# Patient Record
Sex: Female | Born: 1955 | Race: White | Hispanic: No | Marital: Single | Smoking: Never smoker
Health system: Southern US, Community
[De-identification: ages and names within clinical notes are randomized; demographics above are authoritative.]

## PROBLEM LIST (undated history)

## (undated) DIAGNOSIS — K219 Gastro-esophageal reflux disease without esophagitis: Secondary | ICD-10-CM

## (undated) DIAGNOSIS — E039 Hypothyroidism, unspecified: Secondary | ICD-10-CM

## (undated) DIAGNOSIS — E079 Disorder of thyroid, unspecified: Secondary | ICD-10-CM

## (undated) DIAGNOSIS — I10 Essential (primary) hypertension: Secondary | ICD-10-CM

## (undated) HISTORY — PX: MENISCUS REPAIR: SHX5179

## (undated) HISTORY — PX: SHOULDER SURGERY: SHX246

## (undated) HISTORY — PX: BREAST BIOPSY: SHX20

---

## 2004-03-10 ENCOUNTER — Ambulatory Visit: Payer: Self-pay | Admitting: Orthopaedic Surgery

## 2004-04-02 ENCOUNTER — Ambulatory Visit: Payer: Self-pay | Admitting: Orthopaedic Surgery

## 2005-11-08 ENCOUNTER — Ambulatory Visit: Payer: Self-pay | Admitting: Orthopaedic Surgery

## 2005-12-06 ENCOUNTER — Ambulatory Visit: Payer: Self-pay | Admitting: Orthopaedic Surgery

## 2005-12-14 ENCOUNTER — Ambulatory Visit: Payer: Self-pay | Admitting: Internal Medicine

## 2005-12-21 ENCOUNTER — Encounter: Payer: Self-pay | Admitting: Orthopaedic Surgery

## 2006-01-10 ENCOUNTER — Encounter: Payer: Self-pay | Admitting: Orthopaedic Surgery

## 2006-09-15 ENCOUNTER — Ambulatory Visit: Payer: Self-pay | Admitting: Internal Medicine

## 2007-01-06 ENCOUNTER — Ambulatory Visit: Payer: Self-pay | Admitting: Internal Medicine

## 2007-09-29 ENCOUNTER — Ambulatory Visit: Payer: Self-pay | Admitting: Orthopaedic Surgery

## 2008-08-02 ENCOUNTER — Ambulatory Visit: Payer: Self-pay | Admitting: Internal Medicine

## 2008-10-02 ENCOUNTER — Other Ambulatory Visit: Payer: Self-pay | Admitting: Unknown Physician Specialty

## 2009-05-20 ENCOUNTER — Ambulatory Visit: Payer: Self-pay

## 2009-05-30 ENCOUNTER — Ambulatory Visit: Payer: Self-pay | Admitting: Unknown Physician Specialty

## 2009-06-11 ENCOUNTER — Ambulatory Visit: Payer: Self-pay | Admitting: Unknown Physician Specialty

## 2010-10-23 ENCOUNTER — Ambulatory Visit: Payer: Self-pay | Admitting: General Practice

## 2010-11-25 ENCOUNTER — Ambulatory Visit: Payer: Self-pay

## 2010-12-11 ENCOUNTER — Ambulatory Visit: Payer: Self-pay | Admitting: Orthopedic Surgery

## 2010-12-21 ENCOUNTER — Ambulatory Visit: Payer: Self-pay | Admitting: Orthopedic Surgery

## 2011-02-12 ENCOUNTER — Ambulatory Visit: Payer: Self-pay | Admitting: Anesthesiology

## 2011-02-22 ENCOUNTER — Ambulatory Visit: Payer: Self-pay | Admitting: Internal Medicine

## 2011-02-23 ENCOUNTER — Ambulatory Visit: Payer: Self-pay | Admitting: Internal Medicine

## 2011-02-25 ENCOUNTER — Ambulatory Visit: Payer: Self-pay | Admitting: Orthopedic Surgery

## 2011-04-05 ENCOUNTER — Encounter: Payer: Self-pay | Admitting: Orthopedic Surgery

## 2011-04-13 ENCOUNTER — Encounter: Payer: Self-pay | Admitting: Orthopedic Surgery

## 2011-04-14 ENCOUNTER — Ambulatory Visit: Payer: Self-pay | Admitting: Internal Medicine

## 2011-05-14 ENCOUNTER — Encounter: Payer: Self-pay | Admitting: Orthopedic Surgery

## 2011-06-11 ENCOUNTER — Encounter: Payer: Self-pay | Admitting: Orthopedic Surgery

## 2011-07-12 ENCOUNTER — Encounter: Payer: Self-pay | Admitting: Orthopedic Surgery

## 2011-08-11 ENCOUNTER — Encounter: Payer: Self-pay | Admitting: Orthopedic Surgery

## 2011-09-02 ENCOUNTER — Ambulatory Visit: Payer: Self-pay | Admitting: Orthopedic Surgery

## 2011-09-11 ENCOUNTER — Encounter: Payer: Self-pay | Admitting: Orthopedic Surgery

## 2011-09-17 ENCOUNTER — Other Ambulatory Visit: Payer: Self-pay | Admitting: Internal Medicine

## 2011-09-17 LAB — CBC WITH DIFFERENTIAL/PLATELET
Basophil #: 0 10*3/uL (ref 0.0–0.1)
Basophil %: 0.8 %
Eosinophil #: 0.1 10*3/uL (ref 0.0–0.7)
Eosinophil %: 2.2 %
HCT: 42.1 % (ref 35.0–47.0)
HGB: 13.8 g/dL (ref 12.0–16.0)
Lymphocyte #: 1.5 10*3/uL (ref 1.0–3.6)
Lymphocyte %: 31.1 %
MCH: 31.5 pg (ref 26.0–34.0)
MCHC: 32.9 g/dL (ref 32.0–36.0)
MCV: 96 fL (ref 80–100)
Monocyte #: 0.3 x10 3/mm (ref 0.2–0.9)
Monocyte %: 5.8 %
Neutrophil #: 3 10*3/uL (ref 1.4–6.5)
Neutrophil %: 60.1 %
Platelet: 248 10*3/uL (ref 150–440)
RBC: 4.39 10*6/uL (ref 3.80–5.20)
RDW: 12.2 % (ref 11.5–14.5)
WBC: 4.9 10*3/uL (ref 3.6–11.0)

## 2011-09-17 LAB — BASIC METABOLIC PANEL
Anion Gap: 10 (ref 7–16)
BUN: 21 mg/dL — ABNORMAL HIGH (ref 7–18)
Calcium, Total: 9.8 mg/dL (ref 8.5–10.1)
Chloride: 106 mmol/L (ref 98–107)
Co2: 29 mmol/L (ref 21–32)
Creatinine: 0.65 mg/dL (ref 0.60–1.30)
EGFR (African American): >60
EGFR (Non-African Amer.): >60
Glucose: 96 mg/dL (ref 65–99)
Osmolality: 292 (ref 275–301)
Potassium: 4.1 mmol/L (ref 3.5–5.1)
Sodium: 145 mmol/L (ref 136–145)

## 2011-09-17 LAB — TSH: Thyroid Stimulating Horm: 0.079 u[IU]/mL — ABNORMAL LOW

## 2012-03-03 ENCOUNTER — Other Ambulatory Visit: Payer: Self-pay | Admitting: Physician Assistant

## 2012-03-03 LAB — CBC WITH DIFFERENTIAL/PLATELET
Basophil #: 0 10*3/uL (ref 0.0–0.1)
Basophil %: 0.5 %
Eosinophil #: 0.2 10*3/uL (ref 0.0–0.7)
Eosinophil %: 2.5 %
HCT: 40.2 % (ref 35.0–47.0)
HGB: 13.7 g/dL (ref 12.0–16.0)
Lymphocyte #: 1.3 10*3/uL (ref 1.0–3.6)
Lymphocyte %: 19.2 %
MCH: 32.2 pg (ref 26.0–34.0)
MCHC: 33.9 g/dL (ref 32.0–36.0)
MCV: 95 fL (ref 80–100)
Monocyte #: 0.4 x10 3/mm (ref 0.2–0.9)
Monocyte %: 5.9 %
Neutrophil #: 4.9 10*3/uL (ref 1.4–6.5)
Neutrophil %: 71.9 %
Platelet: 238 10*3/uL (ref 150–440)
RBC: 4.23 10*6/uL (ref 3.80–5.20)
RDW: 13.3 % (ref 11.5–14.5)
WBC: 6.8 10*3/uL (ref 3.6–11.0)

## 2012-03-03 LAB — COMPREHENSIVE METABOLIC PANEL
Albumin: 3.9 g/dL (ref 3.4–5.0)
Alkaline Phosphatase: 143 U/L — ABNORMAL HIGH (ref 50–136)
Anion Gap: 5 — ABNORMAL LOW (ref 7–16)
BUN: 18 mg/dL (ref 7–18)
Bilirubin,Total: 0.2 mg/dL (ref 0.2–1.0)
Calcium, Total: 9.3 mg/dL (ref 8.5–10.1)
Chloride: 108 mmol/L — ABNORMAL HIGH (ref 98–107)
Co2: 28 mmol/L (ref 21–32)
Creatinine: 0.58 mg/dL — ABNORMAL LOW (ref 0.60–1.30)
EGFR (African American): >60
EGFR (Non-African Amer.): >60
Glucose: 98 mg/dL (ref 65–99)
Osmolality: 283 (ref 275–301)
Potassium: 3.9 mmol/L (ref 3.5–5.1)
SGOT(AST): 43 U/L — ABNORMAL HIGH (ref 15–37)
SGPT (ALT): 73 U/L (ref 12–78)
Sodium: 141 mmol/L (ref 136–145)
Total Protein: 6.8 g/dL (ref 6.4–8.2)

## 2012-03-03 LAB — FOLATE: Folic Acid: 18.7 ng/mL (ref 3.1–100.0)

## 2012-03-03 LAB — TSH: Thyroid Stimulating Horm: 1.59 u[IU]/mL

## 2012-08-29 ENCOUNTER — Encounter: Payer: Self-pay | Admitting: Anesthesiology

## 2012-09-10 ENCOUNTER — Encounter: Payer: Self-pay | Admitting: Anesthesiology

## 2012-10-10 ENCOUNTER — Encounter: Payer: Self-pay | Admitting: Anesthesiology

## 2013-05-17 ENCOUNTER — Ambulatory Visit: Payer: Self-pay | Admitting: Internal Medicine

## 2014-02-07 ENCOUNTER — Ambulatory Visit: Payer: Self-pay | Admitting: Unknown Physician Specialty

## 2014-05-06 DIAGNOSIS — G629 Polyneuropathy, unspecified: Secondary | ICD-10-CM | POA: Insufficient documentation

## 2014-05-17 ENCOUNTER — Ambulatory Visit: Payer: Self-pay | Admitting: Internal Medicine

## 2014-05-17 DIAGNOSIS — M7989 Other specified soft tissue disorders: Secondary | ICD-10-CM | POA: Insufficient documentation

## 2014-07-16 ENCOUNTER — Encounter: Payer: Self-pay | Admitting: *Deleted

## 2015-05-12 DIAGNOSIS — I10 Essential (primary) hypertension: Secondary | ICD-10-CM | POA: Diagnosis not present

## 2015-05-12 DIAGNOSIS — M810 Age-related osteoporosis without current pathological fracture: Secondary | ICD-10-CM | POA: Diagnosis not present

## 2015-05-12 DIAGNOSIS — G629 Polyneuropathy, unspecified: Secondary | ICD-10-CM | POA: Diagnosis not present

## 2015-05-12 DIAGNOSIS — E079 Disorder of thyroid, unspecified: Secondary | ICD-10-CM | POA: Diagnosis not present

## 2015-05-22 DIAGNOSIS — M810 Age-related osteoporosis without current pathological fracture: Secondary | ICD-10-CM | POA: Diagnosis not present

## 2015-05-27 DIAGNOSIS — Z1231 Encounter for screening mammogram for malignant neoplasm of breast: Secondary | ICD-10-CM | POA: Diagnosis not present

## 2015-05-27 DIAGNOSIS — M810 Age-related osteoporosis without current pathological fracture: Secondary | ICD-10-CM | POA: Diagnosis not present

## 2015-05-27 DIAGNOSIS — Z01419 Encounter for gynecological examination (general) (routine) without abnormal findings: Secondary | ICD-10-CM | POA: Diagnosis not present

## 2015-05-27 DIAGNOSIS — Z1211 Encounter for screening for malignant neoplasm of colon: Secondary | ICD-10-CM | POA: Diagnosis not present

## 2015-05-27 DIAGNOSIS — N951 Menopausal and female climacteric states: Secondary | ICD-10-CM | POA: Diagnosis not present

## 2015-06-25 DIAGNOSIS — E039 Hypothyroidism, unspecified: Secondary | ICD-10-CM | POA: Diagnosis not present

## 2015-06-27 ENCOUNTER — Encounter: Payer: Self-pay | Admitting: Physician Assistant

## 2015-06-27 ENCOUNTER — Ambulatory Visit: Payer: Self-pay | Admitting: Physician Assistant

## 2015-06-27 VITALS — BP 122/90 | HR 76 | Temp 99.1°F

## 2015-06-27 DIAGNOSIS — J101 Influenza due to other identified influenza virus with other respiratory manifestations: Secondary | ICD-10-CM

## 2015-06-27 LAB — POCT INFLUENZA A/B
Influenza A, POC: POSITIVE — AB
Influenza B, POC: NEGATIVE

## 2015-06-27 MED ORDER — OSELTAMIVIR PHOSPHATE 75 MG PO CAPS: 75.0000 mg | ORAL_CAPSULE | Freq: Two times a day (BID) | ORAL | Status: AC

## 2015-06-27 NOTE — Progress Notes (Signed)
S: C/o runny nose, congestion, sore throat with dry cough for 3 days, + fever, chills, denies cp/sob, v/d; mucus was green this am but clear throughout the day, cough is sporadic, throat is really sore  Using otc meds: robitussin  O: PE: vitals w low grade temp, nad,  perrl eomi, normocephalic, tms dull, nasal mucosa red and swollen, throat injected, neck supple no lymph, lungs c t a, cv rrr, neuro intact, flu swab + A  A:  Acute flu like illness, influenza A   P: tamiflu 75mg  bid x 5d, drink fluids, continue regular meds , use otc meds of choice, return if not improving in 5 days, return earlier if worsening

## 2015-06-30 DIAGNOSIS — J101 Influenza due to other identified influenza virus with other respiratory manifestations: Secondary | ICD-10-CM | POA: Diagnosis not present

## 2015-06-30 DIAGNOSIS — J019 Acute sinusitis, unspecified: Secondary | ICD-10-CM | POA: Diagnosis not present

## 2015-07-22 DIAGNOSIS — M810 Age-related osteoporosis without current pathological fracture: Secondary | ICD-10-CM | POA: Diagnosis not present

## 2015-08-20 DIAGNOSIS — R102 Pelvic and perineal pain: Secondary | ICD-10-CM | POA: Diagnosis not present

## 2015-08-21 DIAGNOSIS — M79601 Pain in right arm: Secondary | ICD-10-CM | POA: Diagnosis not present

## 2015-08-21 DIAGNOSIS — M79671 Pain in right foot: Secondary | ICD-10-CM | POA: Diagnosis not present

## 2015-08-21 DIAGNOSIS — M79672 Pain in left foot: Secondary | ICD-10-CM | POA: Diagnosis not present

## 2015-08-21 DIAGNOSIS — E039 Hypothyroidism, unspecified: Secondary | ICD-10-CM | POA: Diagnosis not present

## 2015-08-21 DIAGNOSIS — M722 Plantar fascial fibromatosis: Secondary | ICD-10-CM | POA: Diagnosis not present

## 2015-08-21 DIAGNOSIS — Z4802 Encounter for removal of sutures: Secondary | ICD-10-CM | POA: Diagnosis not present

## 2015-09-03 ENCOUNTER — Ambulatory Visit: Payer: Self-pay | Admitting: Podiatry

## 2015-09-04 DIAGNOSIS — E039 Hypothyroidism, unspecified: Secondary | ICD-10-CM | POA: Diagnosis not present

## 2015-09-15 DIAGNOSIS — H43813 Vitreous degeneration, bilateral: Secondary | ICD-10-CM | POA: Diagnosis not present

## 2016-02-10 DIAGNOSIS — E034 Atrophy of thyroid (acquired): Secondary | ICD-10-CM | POA: Diagnosis not present

## 2016-02-19 DIAGNOSIS — E079 Disorder of thyroid, unspecified: Secondary | ICD-10-CM | POA: Diagnosis not present

## 2016-02-19 DIAGNOSIS — G629 Polyneuropathy, unspecified: Secondary | ICD-10-CM | POA: Diagnosis not present

## 2016-02-19 DIAGNOSIS — M810 Age-related osteoporosis without current pathological fracture: Secondary | ICD-10-CM | POA: Diagnosis not present

## 2016-02-19 DIAGNOSIS — I10 Essential (primary) hypertension: Secondary | ICD-10-CM | POA: Diagnosis not present

## 2016-05-18 DIAGNOSIS — M79671 Pain in right foot: Secondary | ICD-10-CM | POA: Diagnosis not present

## 2016-05-18 DIAGNOSIS — M722 Plantar fascial fibromatosis: Secondary | ICD-10-CM | POA: Diagnosis not present

## 2016-05-25 DIAGNOSIS — M722 Plantar fascial fibromatosis: Secondary | ICD-10-CM | POA: Diagnosis not present

## 2016-05-25 DIAGNOSIS — M79671 Pain in right foot: Secondary | ICD-10-CM | POA: Diagnosis not present

## 2016-06-21 ENCOUNTER — Telehealth: Payer: Self-pay | Admitting: Family

## 2016-06-21 DIAGNOSIS — J019 Acute sinusitis, unspecified: Secondary | ICD-10-CM

## 2016-06-21 MED ORDER — AMOXICILLIN-POT CLAVULANATE 875-125 MG PO TABS: 1.0000 | ORAL_TABLET | Freq: Two times a day (BID) | ORAL | 0 refills | Status: DC

## 2016-06-21 NOTE — Progress Notes (Signed)

## 2016-08-10 DIAGNOSIS — I10 Essential (primary) hypertension: Secondary | ICD-10-CM | POA: Diagnosis not present

## 2016-08-10 DIAGNOSIS — Z79899 Other long term (current) drug therapy: Secondary | ICD-10-CM | POA: Diagnosis not present

## 2016-08-10 DIAGNOSIS — E079 Disorder of thyroid, unspecified: Secondary | ICD-10-CM | POA: Diagnosis not present

## 2016-08-11 ENCOUNTER — Emergency Department
Admission: EM | Admit: 2016-08-11 | Discharge: 2016-08-11 | Disposition: A | Discharge status: Home / Self Care | Payer: 59 | Attending: Student in an Organized Health Care Education/Training Program | Admitting: Student in an Organized Health Care Education/Training Program

## 2016-08-11 ENCOUNTER — Encounter: Payer: Self-pay | Admitting: Emergency Medicine

## 2016-08-11 ENCOUNTER — Emergency Department: Payer: 59

## 2016-08-11 DIAGNOSIS — Y929 Unspecified place or not applicable: Secondary | ICD-10-CM | POA: Insufficient documentation

## 2016-08-11 DIAGNOSIS — Y9301 Activity, walking, marching and hiking: Secondary | ICD-10-CM | POA: Diagnosis not present

## 2016-08-11 DIAGNOSIS — Z79899 Other long term (current) drug therapy: Secondary | ICD-10-CM | POA: Diagnosis not present

## 2016-08-11 DIAGNOSIS — E039 Hypothyroidism, unspecified: Secondary | ICD-10-CM | POA: Insufficient documentation

## 2016-08-11 DIAGNOSIS — S52614A Nondisplaced fracture of right ulna styloid process, initial encounter for closed fracture: Secondary | ICD-10-CM | POA: Diagnosis not present

## 2016-08-11 DIAGNOSIS — I10 Essential (primary) hypertension: Secondary | ICD-10-CM | POA: Insufficient documentation

## 2016-08-11 DIAGNOSIS — S52611A Displaced fracture of right ulna styloid process, initial encounter for closed fracture: Secondary | ICD-10-CM | POA: Diagnosis not present

## 2016-08-11 DIAGNOSIS — M7989 Other specified soft tissue disorders: Secondary | ICD-10-CM | POA: Diagnosis not present

## 2016-08-11 DIAGNOSIS — W010XXA Fall on same level from slipping, tripping and stumbling without subsequent striking against object, initial encounter: Secondary | ICD-10-CM | POA: Diagnosis not present

## 2016-08-11 DIAGNOSIS — G629 Polyneuropathy, unspecified: Secondary | ICD-10-CM | POA: Diagnosis not present

## 2016-08-11 DIAGNOSIS — Z0000 Encounter for general adult medical examination without abnormal findings: Secondary | ICD-10-CM | POA: Diagnosis not present

## 2016-08-11 DIAGNOSIS — E079 Disorder of thyroid, unspecified: Secondary | ICD-10-CM | POA: Diagnosis not present

## 2016-08-11 DIAGNOSIS — Y999 Unspecified external cause status: Secondary | ICD-10-CM | POA: Diagnosis not present

## 2016-08-11 DIAGNOSIS — S6991XA Unspecified injury of right wrist, hand and finger(s), initial encounter: Secondary | ICD-10-CM | POA: Diagnosis present

## 2016-08-11 DIAGNOSIS — S52501A Unspecified fracture of the lower end of right radius, initial encounter for closed fracture: Secondary | ICD-10-CM

## 2016-08-11 HISTORY — DX: Disorder of thyroid, unspecified: E07.9

## 2016-08-11 HISTORY — DX: Essential (primary) hypertension: I10

## 2016-08-11 MED ORDER — ONDANSETRON HCL 4 MG PO TABS: 4.0000 mg | ORAL_TABLET | Freq: Three times a day (TID) | ORAL | 1 refills | Status: AC | PRN

## 2016-08-11 MED ORDER — ONDANSETRON HCL 4 MG PO TABS
4.0000 mg | ORAL_TABLET | Freq: Once | ORAL | Status: AC
  Administered 2016-08-11: 4 mg via ORAL
  Filled 2016-08-11: qty 1

## 2016-08-11 MED ORDER — OXYCODONE-ACETAMINOPHEN 5-325 MG PO TABS: 1.0000 | ORAL_TABLET | Freq: Four times a day (QID) | ORAL | 0 refills | Status: AC | PRN

## 2016-08-11 MED ORDER — OXYCODONE-ACETAMINOPHEN 5-325 MG PO TABS
1.0000 | ORAL_TABLET | Freq: Once | ORAL | Status: AC
Start: 2016-08-11 — End: 2016-08-11
  Administered 2016-08-11: 1 via ORAL
  Filled 2016-08-11: qty 1

## 2016-08-11 NOTE — ED Notes (Signed)
See triage note  States she tripped and landed on right wrist   Positive deformity   Positive pulses

## 2016-08-11 NOTE — ED Provider Notes (Signed)
[facility] Emergency Department Provider Note  ____________________________________________  Time seen: Approximately 7:30 PM  I have reviewed the triage vital signs and the nursing notes.   HISTORY  Chief Complaint Wrist Pain    HPI Erin Bruce is a 61 y.o. female presenting to the emergency department with acute 10 out of 10 right wrist pain. Patient states "pain is significant". She states that she was ambulating down the street when she tripped. Patient did not hit her head or lose consciousness. Patient denies prior traumas or surgeries affecting the right upper extremity. Patient denies radiculopathy, weakness or loss of sensation. Patient denies associated chest pain, chest tightness, shortness of breath, nausea, vomiting or abdominal pain. Patient's pain is worsened with movement. Patient currently works as a Copy. No alleviating measures have been undertaken.   Past Medical History:  Diagnosis Date  . Hypertension   . Thyroid disease     There are no active problems to display for this patient.   Past Surgical History:  Procedure Laterality Date  . SHOULDER SURGERY      Prior to Admission medications   Medication Sig Start Date End Date Taking? Authorizing Provider  amoxicillin-clavulanate (AUGMENTIN) 875-125 MG tablet Take 1 tablet by mouth 2 (two) times daily. 06/21/16   Junie Spencer, FNP  ARMOUR THYROID 15 MG tablet  05/20/15   Historical Provider, MD  enalapril (VASOTEC) 10 MG tablet Take by mouth. 05/12/15 05/11/16  Historical Provider, MD  HYDROcodone-acetaminophen (NORCO/VICODIN) 5-325 MG tablet Take by mouth. 05/27/15   Historical Provider, MD  losartan (COZAAR) 50 MG tablet  06/23/15   Historical Provider, MD  omeprazole (PRILOSEC) 40 MG capsule  06/23/15   Historical Provider, MD  tiZANidine (ZANAFLEX) 4 MG tablet  06/23/15   Historical Provider, MD    Allergies Alendronate; Buspirone; Diltiazem; and Tramadol  No  family history on file.  Social History Social History  Substance Use Topics  . Smoking status: Never Smoker  . Smokeless tobacco: Not on file  . Alcohol use Not on file     Review of Systems  Constitutional: No fever/chills Eyes: No visual changes. No discharge ENT: No upper respiratory complaints. Cardiovascular: no chest pain. Respiratory: no cough. No SOB. Gastrointestinal: No abdominal pain.  No nausea, no vomiting.  No diarrhea.  No constipation. Musculoskeletal: Patient has right wrist pain. Skin: Negative for rash, abrasions, lacerations, ecchymosis. Neurological: Negative for headaches, focal weakness or numbness.   ____________________________________________   PHYSICAL EXAM:  VITAL SIGNS: ED Triage Vitals  Enc Vitals Group     BP 08/11/16 1836 (!) 143/89     Pulse Rate 08/11/16 1836 (!) 104     Resp 08/11/16 1836 20     Temp 08/11/16 1836 98.4 F (36.9 C)     Temp Source 08/11/16 1836 Oral     SpO2 08/11/16 1836 96 %     Weight 08/11/16 1837 146 lb (66.2 kg)     Height 08/11/16 1837  (1.702 m)     Head Circumference --      Peak Flow --      Pain Score 08/11/16 1836 10     Pain Loc --      Pain Edu? --      Excl. in GC? --      Constitutional: Alert and oriented. Well appearing and in no acute distress. Eyes: Conjunctivae are normal. PERRL. EOMI. Head: Atraumatic. Hematological/Lymphatic/Immunilogical: No cervical lymphadenopathy. Cardiovascular: Normal rate, regular rhythm. Normal S1  and S2.  Good peripheral circulation. Respiratory: Normal respiratory effort without tachypnea or retractions. Lungs CTAB. Good air entry to the bases with no decreased or absent breath sounds. Musculoskeletal:Patient has 5 out of 5 strength in the upper extremities bilaterally. Right upper extremity: Patient is able to perform full range of motion at the right shoulder and right elbow. Patient is able to perform limited flexion and extension at the right wrist,  likely secondary to pain. Patient is able to move all 5 right fingers. To inspection, wrist deformity visualized. Palpable radial and ulnar pulses bilaterally and symmetrically. Neurologic:  Normal speech and language. No gross focal neurologic deficits are appreciated. Reflexes are 2+ and symmetric in the upper extremities bilaterally. Skin:  No skin compromise visualized. Psychiatric: Mood and affect are normal. Speech and behavior are normal. Patient exhibits appropriate insight and judgement.   ____________________________________________   LABS (all labs ordered are listed, but only abnormal results are displayed)  Labs Reviewed - No data to display ____________________________________________  EKG   ____________________________________________  RADIOLOGY Geraldo Pitter, personally viewed and evaluated these images (plain radiographs) as part of my medical decision making, as well as reviewing the written report by the radiologist.  Dg Wrist Complete Right  Result Date: 08/11/2016 CLINICAL DATA:  Tripped and fell onto the outstretched right hand today. Pain and deformity. EXAM: RIGHT WRIST - COMPLETE 3+ VIEW COMPARISON:  None. FINDINGS: There is a comminuted fracture of the distal radial metaphysis extending to the epiphysis. The primary fracture is transverse with secondary fractures extending to the dorsal ulnar articular surface. The fracture is impacted dorsally leading to significant dorsal angulation of the distal radial articular surface, of 45 degrees. There is associated ulnar styloid fracture without significant displacement. The joints are normally spaced and aligned. There is significant surrounding soft tissue swelling. IMPRESSION: 1. Comminuted, dorsally impacted and angulated, fracture of the distal radial metaphysis with an intra-articular component. 2. Associated ulnar styloid fracture. 3. No dislocation. Electronically Signed   By: Amie Portland M.D.   On: 08/11/2016  19:00    ____________________________________________    PROCEDURES  Procedure(s) performed:    Procedures    Medications  oxyCODONE-acetaminophen (PERCOCET/ROXICET) 5-325 MG per tablet 1 tablet (not administered)  ondansetron (ZOFRAN) tablet 4 mg (not administered)     ____________________________________________   INITIAL IMPRESSION / ASSESSMENT AND PLAN / ED COURSE  Pertinent labs & imaging results that were available during my care of the patient were reviewed by me and considered in my medical decision making (see chart for details).  Review of the [place] CSRS was performed in accordance of the [facility] prior to dispensing any controlled drugs.     Assessment and plan: Right Wrist Pain: Patient presents to the emergency department with right wrist pain after tripping and falling on an outstretched right hand. DG right wrist reveals a comminuted distal radius fracture with involvement of the ulna. Ulnar styloid fracture visualized. Patient was placed in a volar splint in the emergency department. Patient was neurovascularly intact after splint application. A Roxicet was provided in the emergency department. Patient was discharged with Roxicet. A referral was given orthopedics, Dr. Ernest Pine.  Vital signs were reassuring prior to discharge. All patient questions were answered. ____________________________________________  FINAL CLINICAL IMPRESSION(S) / ED DIAGNOSES  Final diagnoses:  None      NEW MEDICATIONS STARTED DURING THIS VISIT:  New Prescriptions   No medications on file        This chart was dictated using  voice recognition software/Dragon. Despite best efforts to proofread, errors can occur which can change the meaning. Any change was purely unintentional.    Orvil Feil, PA-C 08/11/16 1944    Willy Eddy, MD 08/12/16 [phone]

## 2016-08-11 NOTE — ED Triage Notes (Signed)
Tripped and fell while walking, deformity R wrist. Ring removed during triage.

## 2016-08-12 DIAGNOSIS — S52531A Colles' fracture of right radius, initial encounter for closed fracture: Secondary | ICD-10-CM | POA: Diagnosis not present

## 2016-08-13 ENCOUNTER — Other Ambulatory Visit: Payer: Self-pay | Admitting: Orthopedic Surgery

## 2016-08-13 ENCOUNTER — Encounter
Admission: RE | Admit: 2016-08-13 | Discharge: 2016-08-13 | Disposition: A | Discharge status: Home / Self Care | Payer: 59 | Source: Ambulatory Visit | Attending: Orthopedic Surgery | Admitting: Orthopedic Surgery

## 2016-08-13 HISTORY — DX: Gastro-esophageal reflux disease without esophagitis: K21.9

## 2016-08-13 HISTORY — DX: Hypothyroidism, unspecified: E03.9

## 2016-08-13 NOTE — Patient Instructions (Signed)
  Your procedure is scheduled on: 08-17-16 (Tuesday) Report to Same Day Surgery 2nd floor medical mall [facility](Medical Mall Entrance-take elevator on left to 2nd floor.  Check in with surgery information desk.) To find out your arrival time please call [phone] between 1PM - 3PM on 08-16-16 (Monday)  Remember: Instructions that are not followed completely may result in serious medical risk, up to and including death, or upon the discretion of your surgeon and anesthesiologist your surgery may need to be rescheduled.    _x___ 1. Do not eat food or drink liquids after midnight. No gum chewing or hard candies.     __x__ 2. No Alcohol for 24 hours before or after surgery.   __x__3. No Smoking for 24 prior to surgery.   ____  4. Bring all medications with you on the day of surgery if instructed.    __x__ 5. Notify your doctor if there is any change in your medical condition     (cold, fever, infections).     Do not wear jewelry, make-up, hairpins, clips or nail polish.  Do not wear lotions, powders, or perfumes. You may wear deodorant.  Do not shave 48 hours prior to surgery. Men may shave face and neck.  Do not bring valuables to the hospital.    [facility] any belongings or valuables.               Contacts, dentures or bridgework may not be worn into surgery.  Leave your suitcase in the car. After surgery it may be brought to your room.  For patients admitted to the hospital, discharge time is determined by your  treatment team.   Patients discharged the day of surgery will not be allowed to drive home.  You will need someone to drive you home and stay with you the night of your procedure.    Please read over the following fact sheets that you were given:     _x___ TAKE THE FOLLOWING MEDICATIONS THE MORNING OF SURGERY WITH A SMALL SIP OF WATER. These include:  1. VASOTEC  2. LEVOTHYROXINE  3. PRILOSEC  4. TAKE AN EXTRA PRILOSEC ON Monday NIGHT BEFORE BED  5. YOU  MAY TAKE PERCOCET AM OF SURGERY IF NEEDED  6.  ____Fleets enema or Magnesium Citrate as directed.   _x___ Use CHG Soap or sage wipes as directed on instruction sheet   ____ Use inhalers on the day of surgery and bring to hospital day of surgery  ____ Stop Metformin and Janumet 2 days prior to surgery.    ____ Take 1/2 of usual insulin dose the night before surgery and none on the morning surgery.   ____ Follow recommendations from Cardiologist, Pulmonologist or PCP regarding stopping Aspirin, Coumadin, Pllavix ,Eliquis, Effient, or Pradaxa, and Pletal.  X____Stop Anti-inflammatories such as Advil, Aleve, Ibuprofen, Motrin, Naproxen, Naprosyn, Goodies powders or aspirin products NOW-OK to take Tylenol OR PERCOCET    ____ Stop supplements until after surgery   ____ Bring C-Pap to the hospital.

## 2016-08-16 NOTE — Pre-Procedure Instructions (Signed)
MEDICAL CLEARANCE ON CHART FROM DR Graciela HusbandsKLEIN

## 2016-08-17 ENCOUNTER — Ambulatory Visit
Admission: RE | Admit: 2016-08-17 | Discharge: 2016-08-17 | Disposition: A | Discharge status: Home / Self Care | Payer: 59 | Source: Ambulatory Visit | Attending: Orthopedic Surgery | Admitting: Orthopedic Surgery

## 2016-08-17 ENCOUNTER — Ambulatory Visit: Payer: 59 | Admitting: Certified Registered"

## 2016-08-17 ENCOUNTER — Encounter
Admission: RE | Disposition: A | Discharge status: Home / Self Care | Payer: Self-pay | Source: Ambulatory Visit | Attending: Orthopedic Surgery

## 2016-08-17 DIAGNOSIS — S52531A Colles' fracture of right radius, initial encounter for closed fracture: Secondary | ICD-10-CM | POA: Insufficient documentation

## 2016-08-17 DIAGNOSIS — K219 Gastro-esophageal reflux disease without esophagitis: Secondary | ICD-10-CM | POA: Insufficient documentation

## 2016-08-17 DIAGNOSIS — Z888 Allergy status to other drugs, medicaments and biological substances status: Secondary | ICD-10-CM | POA: Insufficient documentation

## 2016-08-17 DIAGNOSIS — Z79899 Other long term (current) drug therapy: Secondary | ICD-10-CM | POA: Insufficient documentation

## 2016-08-17 DIAGNOSIS — G8918 Other acute postprocedural pain: Secondary | ICD-10-CM | POA: Diagnosis not present

## 2016-08-17 DIAGNOSIS — I10 Essential (primary) hypertension: Secondary | ICD-10-CM | POA: Insufficient documentation

## 2016-08-17 DIAGNOSIS — S52501A Unspecified fracture of the lower end of right radius, initial encounter for closed fracture: Secondary | ICD-10-CM | POA: Diagnosis present

## 2016-08-17 DIAGNOSIS — Y939 Activity, unspecified: Secondary | ICD-10-CM | POA: Diagnosis not present

## 2016-08-17 DIAGNOSIS — E039 Hypothyroidism, unspecified: Secondary | ICD-10-CM | POA: Insufficient documentation

## 2016-08-17 DIAGNOSIS — M25531 Pain in right wrist: Secondary | ICD-10-CM | POA: Diagnosis not present

## 2016-08-17 DIAGNOSIS — W010XXA Fall on same level from slipping, tripping and stumbling without subsequent striking against object, initial encounter: Secondary | ICD-10-CM | POA: Diagnosis not present

## 2016-08-17 HISTORY — PX: OPEN REDUCTION INTERNAL FIXATION (ORIF) DISTAL RADIAL FRACTURE: SHX5989

## 2016-08-17 LAB — PROTIME-INR
INR: 0.95
Prothrombin Time: 12.7 seconds (ref 11.4–15.2)

## 2016-08-17 LAB — APTT: aPTT: 26 seconds (ref 24–36)

## 2016-08-17 SURGERY — OPEN REDUCTION INTERNAL FIXATION (ORIF) DISTAL RADIUS FRACTURE
Anesthesia: Regional | Site: Wrist | Laterality: Right | Wound class: Clean

## 2016-08-17 MED ORDER — OXYCODONE HCL 5 MG PO TABS: 5.0000 mg | ORAL_TABLET | ORAL | 0 refills | Status: DC | PRN

## 2016-08-17 MED ORDER — FENTANYL CITRATE (PF) 100 MCG/2ML IJ SOLN
INTRAMUSCULAR | Status: AC
  Administered 2016-08-17: 100 ug
  Filled 2016-08-17: qty 2

## 2016-08-17 MED ORDER — ONDANSETRON HCL 4 MG PO TABS: 4.0000 mg | ORAL_TABLET | Freq: Three times a day (TID) | ORAL | 0 refills | Status: DC | PRN

## 2016-08-17 MED ORDER — KETOROLAC TROMETHAMINE 30 MG/ML IJ SOLN
INTRAMUSCULAR | Status: AC
  Filled 2016-08-17: qty 1

## 2016-08-17 MED ORDER — KETOROLAC TROMETHAMINE 30 MG/ML IJ SOLN
INTRAMUSCULAR | Status: DC | PRN
  Administered 2016-08-17: 30 mg via INTRAVENOUS

## 2016-08-17 MED ORDER — LIDOCAINE HCL (PF) 2 % IJ SOLN
INTRAMUSCULAR | Status: AC
  Filled 2016-08-17: qty 2

## 2016-08-17 MED ORDER — PROPOFOL 10 MG/ML IV BOLUS
INTRAVENOUS | Status: AC
  Filled 2016-08-17: qty 20

## 2016-08-17 MED ORDER — ONDANSETRON HCL 4 MG/2ML IJ SOLN
INTRAMUSCULAR | Status: DC | PRN
  Administered 2016-08-17: 4 mg via INTRAVENOUS

## 2016-08-17 MED ORDER — FENTANYL CITRATE (PF) 100 MCG/2ML IJ SOLN
INTRAMUSCULAR | Status: AC
  Filled 2016-08-17: qty 2

## 2016-08-17 MED ORDER — LIDOCAINE HCL (PF) 1 % IJ SOLN
INTRAMUSCULAR | Status: DC | PRN
  Administered 2016-08-17: 1 mL via INTRADERMAL

## 2016-08-17 MED ORDER — NEOMYCIN-POLYMYXIN B GU 40-200000 IR SOLN
Status: DC | PRN
  Administered 2016-08-17: 2 mL

## 2016-08-17 MED ORDER — ROPIVACAINE HCL 2 MG/ML IJ SOLN
INTRAMUSCULAR | Status: AC
  Filled 2016-08-17: qty 20

## 2016-08-17 MED ORDER — BUPIVACAINE HCL (PF) 0.5 % IJ SOLN
INTRAMUSCULAR | Status: AC
  Filled 2016-08-17: qty 30

## 2016-08-17 MED ORDER — GLYCOPYRROLATE 0.2 MG/ML IJ SOLN
INTRAMUSCULAR | Status: AC
  Filled 2016-08-17: qty 1

## 2016-08-17 MED ORDER — FENTANYL CITRATE (PF) 100 MCG/2ML IJ SOLN: 25.0000 ug | INTRAMUSCULAR | Status: DC | PRN

## 2016-08-17 MED ORDER — CEFAZOLIN SODIUM-DEXTROSE 2-4 GM/100ML-% IV SOLN
INTRAVENOUS | Status: AC
  Filled 2016-08-17: qty 100

## 2016-08-17 MED ORDER — LIDOCAINE HCL (PF) 1 % IJ SOLN
INTRAMUSCULAR | Status: AC
  Filled 2016-08-17: qty 5

## 2016-08-17 MED ORDER — LIDOCAINE HCL (PF) 2 % IJ SOLN
INTRAMUSCULAR | Status: DC | PRN
  Administered 2016-08-17: 50 mg

## 2016-08-17 MED ORDER — DEXAMETHASONE SODIUM PHOSPHATE 10 MG/ML IJ SOLN
INTRAMUSCULAR | Status: DC | PRN
  Administered 2016-08-17: 5 mg via INTRAVENOUS

## 2016-08-17 MED ORDER — CHLORHEXIDINE GLUCONATE CLOTH 2 % EX PADS: 6.0000 | MEDICATED_PAD | Freq: Once | CUTANEOUS | Status: DC

## 2016-08-17 MED ORDER — FENTANYL CITRATE (PF) 100 MCG/2ML IJ SOLN
INTRAMUSCULAR | Status: DC | PRN
  Administered 2016-08-17 (×2): 25 ug via INTRAVENOUS
  Administered 2016-08-17: 50 ug via INTRAVENOUS

## 2016-08-17 MED ORDER — ROPIVACAINE HCL 5 MG/ML IJ SOLN
INTRAMUSCULAR | Status: DC | PRN
  Administered 2016-08-17 (×3): 10 mL via PERINEURAL

## 2016-08-17 MED ORDER — MIDAZOLAM HCL 2 MG/2ML IJ SOLN
INTRAMUSCULAR | Status: AC
  Administered 2016-08-17: 2 mg
  Filled 2016-08-17: qty 2

## 2016-08-17 MED ORDER — CEFAZOLIN SODIUM-DEXTROSE 2-4 GM/100ML-% IV SOLN
2.0000 g | INTRAVENOUS | Status: AC
  Administered 2016-08-17: 2 g via INTRAVENOUS

## 2016-08-17 MED ORDER — GLYCOPYRROLATE 0.2 MG/ML IJ SOLN
INTRAMUSCULAR | Status: DC | PRN
  Administered 2016-08-17: 0.2 mg via INTRAVENOUS

## 2016-08-17 MED ORDER — NEOMYCIN-POLYMYXIN B GU 40-200000 IR SOLN
Status: AC
  Filled 2016-08-17: qty 2

## 2016-08-17 MED ORDER — PROPOFOL 10 MG/ML IV BOLUS
INTRAVENOUS | Status: DC | PRN
  Administered 2016-08-17: 150 mg via INTRAVENOUS

## 2016-08-17 MED ORDER — ONDANSETRON HCL 4 MG/2ML IJ SOLN
INTRAMUSCULAR | Status: AC
  Filled 2016-08-17: qty 2

## 2016-08-17 MED ORDER — PHENYLEPHRINE HCL 10 MG/ML IJ SOLN
INTRAMUSCULAR | Status: DC | PRN
  Administered 2016-08-17 (×11): 100 ug via INTRAVENOUS

## 2016-08-17 MED ORDER — LACTATED RINGERS IV SOLN
INTRAVENOUS | Status: DC
  Administered 2016-08-17 (×2): via INTRAVENOUS

## 2016-08-17 MED ORDER — EPHEDRINE SULFATE 50 MG/ML IJ SOLN
INTRAMUSCULAR | Status: DC | PRN
  Administered 2016-08-17 (×4): 10 mg via INTRAVENOUS

## 2016-08-17 SURGICAL SUPPLY — 62 items
BANDAGE ACE 4X5 VEL STRL LF (GAUZE/BANDAGES/DRESSINGS) ×4 IMPLANT
BANDAGE ELASTIC 3 LF NS (GAUZE/BANDAGES/DRESSINGS) ×4 IMPLANT
BANDAGE ELASTIC 4 LF NS (GAUZE/BANDAGES/DRESSINGS) ×2 IMPLANT
BIT DRILL 2 FAST STEP (BIT) ×2 IMPLANT
BIT DRILL 2.5X4 QC (BIT) ×2 IMPLANT
BNDG COHESIVE 4X5 TAN STRL (GAUZE/BANDAGES/DRESSINGS) ×2 IMPLANT
BNDG ESMARK 4X12 TAN STRL LF (GAUZE/BANDAGES/DRESSINGS) ×2 IMPLANT
BNDG PLASTER FAST 3X3 WHT LF (CAST SUPPLIES) ×4 IMPLANT
CANISTER SUCT 1200ML W/VALVE (MISCELLANEOUS) ×2 IMPLANT
CORD BIP STRL DISP 12FT (MISCELLANEOUS) ×2 IMPLANT
CUFF TOURN 18 STER (MISCELLANEOUS) ×2 IMPLANT
DRAPE FLUOR MINI C-ARM 54X84 (DRAPES) ×2 IMPLANT
DRAPE SURG 17X11 SM STRL (DRAPES) ×2 IMPLANT
DRIVER PEG 2.0 FAST (Orthopedic Implant) ×2 IMPLANT
DURAPREP 26ML APPLICATOR (WOUND CARE) ×2 IMPLANT
ELECT REM PT RETURN 9FT ADLT (ELECTROSURGICAL) ×2
ELECTRODE REM PT RTRN 9FT ADLT (ELECTROSURGICAL) ×1 IMPLANT
FORCEPS JEWEL BIP 4-3/4 STR (INSTRUMENTS) ×2 IMPLANT
GAUZE FLUFF 18X24 1PLY STRL (GAUZE/BANDAGES/DRESSINGS) ×2 IMPLANT
GAUZE PETRO XEROFOAM 1X8 (MISCELLANEOUS) ×2 IMPLANT
GAUZE SPONGE 4X4 12PLY STRL (GAUZE/BANDAGES/DRESSINGS) ×2 IMPLANT
GLOVE BIO SURGEON STRL SZ7.5 (GLOVE) ×2 IMPLANT
GLOVE BIOGEL PI IND STRL 9 (GLOVE) ×1 IMPLANT
GLOVE BIOGEL PI INDICATOR 9 (GLOVE) ×1
GLOVE INDICATOR 7.5 STRL GRN (GLOVE) ×2 IMPLANT
GLOVE SURG 9.0 ORTHO LTXF (GLOVE) ×2 IMPLANT
GOWN STRL REUS TWL 2XL XL LVL4 (GOWN DISPOSABLE) ×2 IMPLANT
GOWN STRL REUS W/ TWL LRG LVL3 (GOWN DISPOSABLE) ×1 IMPLANT
GOWN STRL REUS W/TWL LRG LVL3 (GOWN DISPOSABLE) ×1
K-WIRE 1.6 (WIRE) ×2
K-WIRE FX5X1.6XNS BN SS (WIRE) ×2
KIT RM TURNOVER STRD PROC AR (KITS) ×2 IMPLANT
KWIRE FX5X1.6XNS BN SS (WIRE) ×2 IMPLANT
NEEDLE FILTER BLUNT 18X 1/2SAF (NEEDLE) ×1
NEEDLE FILTER BLUNT 18X1 1/2 (NEEDLE) ×1 IMPLANT
NS IRRIG 500ML POUR BTL (IV SOLUTION) ×2 IMPLANT
PACK EXTREMITY ARMC (MISCELLANEOUS) ×2 IMPLANT
PAD CAST CTTN 4X4 STRL (SOFTGOODS) ×2 IMPLANT
PAD PREP 24X41 OB/GYN DISP (PERSONAL CARE ITEMS) ×2 IMPLANT
PADDING CAST COTTON 4X4 STRL (SOFTGOODS) ×2
PEG DRIVER 2.0MM FAST IMPLANT
PEG FULLY THREADED 2.5X22MM (Peg) ×4 IMPLANT
PEG SUBCHONDRAL SMOOTH 2.0X18 (Peg) ×2 IMPLANT
PLATE SHORT 24.4X51.3 RT (Plate) ×2 IMPLANT
SCREW BN 12X3.5XNS CORT TI (Screw) ×2 IMPLANT
SCREW CORT 3.5X12 (Screw) ×2 IMPLANT
SCREW CORT 3.5X14 LNG (Screw) ×2 IMPLANT
SCREW MULTI DIRECT 18MM (Screw) ×2 IMPLANT
SCREW PEG LOCK 2.5X16 (Peg) IMPLANT
SCREW PEG LOCK 2.5X18 (Peg) ×2 IMPLANT
SCREW PEG LOCK 2.5X20 (Peg) ×4 IMPLANT
SLING ARM M TX990204 (SOFTGOODS) ×2 IMPLANT
SPLINT CAST 1 STEP 4X30 (MISCELLANEOUS) ×2 IMPLANT
STOCKINETTE 48X4 2 PLY STRL (GAUZE/BANDAGES/DRESSINGS) ×1 IMPLANT
STOCKINETTE STRL 4IN 9604848 (GAUZE/BANDAGES/DRESSINGS) ×2 IMPLANT
STRIP CLOSURE SKIN 1/2X4 (GAUZE/BANDAGES/DRESSINGS) ×2 IMPLANT
SUT MNCRL AB 4-0 PS2 18 (SUTURE) ×2 IMPLANT
SUT VIC AB 0 CT2 27 (SUTURE) ×2 IMPLANT
SUT VIC AB 3-0 SH 27 (SUTURE) ×1
SUT VIC AB 3-0 SH 27X BRD (SUTURE) ×1 IMPLANT
SYRINGE 10CC LL (SYRINGE) ×2 IMPLANT
TAPE TRANSPORE STRL 2 31045 (GAUZE/BANDAGES/DRESSINGS) ×2 IMPLANT

## 2016-08-17 NOTE — Anesthesia Postprocedure Evaluation (Signed)
Anesthesia Post Note  Patient: Alfredo MartinezSharon G Dalto  Procedure(s) Performed: Procedure(s) (LRB): OPEN REDUCTION INTERNAL FIXATION (ORIF) DISTAL RADIAL FRACTURE (Right)  Patient location during evaluation: PACU Anesthesia Type: Regional Level of consciousness: awake and alert Pain management: pain level controlled Vital Signs Assessment: post-procedure vital signs reviewed and stable Respiratory status: spontaneous breathing, nonlabored ventilation, respiratory function stable and patient connected to nasal cannula oxygen Cardiovascular status: blood pressure returned to baseline and stable Postop Assessment: no signs of nausea or vomiting Anesthetic complications: no     Last Vitals:  Vitals:   08/17/16 1339 08/17/16 1347  BP: 122/69 123/62  Pulse: 95 93  Resp: 16 16  Temp:  (!) 35.6 C    Last Pain:  Vitals:   08/17/16 1347  TempSrc: Temporal  PainSc:                  Cleda MccreedyJoseph K Piscitello

## 2016-08-17 NOTE — Op Note (Signed)
08/17/2016  1:27 PM  PATIENT:  Erin Bruce    PRE-OPERATIVE DIAGNOSIS:  Z61.096E Colles' fracture of right radius, init for clos fx  POST-OPERATIVE DIAGNOSIS:  Same  PROCEDURE:  OPEN REDUCTION INTERNAL FIXATION (ORIF)  RIGHT DISTAL RADIAL FRACTURE  SURGEON:  Juanell Fairly, MD  ANESTHESIA:   General  PREOPERATIVE INDICATIONS:  Erin Bruce is a  61 y.o. female with a diagnosis of S52.531A displaced Colles' fracture of right radius.  Given the patient's high level of activity at baseline combined with the displacement of the fracture I recommended open reduction internal fixation to treat this injury.  The risks benefits and alternatives were discussed with the patient preoperatively including but not limited to the risks of infection, bleeding, nerve injury, malunion, nonunion, wrist stiffness, persistent wrist pain, osteoarthritis and the need for further surgery. Medical risks include but are not limited to DVT and pulmonary embolism, myocardial infarction, stroke, pneumonia, respiratory failure and death. Patient and her husband understood these risks and wished to proceed.   OPERATIVE IMPLANTS: [employer] hand innovations plate  OPERATIVE FINDINGS: Comminuted fractures of the distal radius with significant dorsal angulation.  OPERATIVE PROCEDURE: Patient was seen in the preoperative area. I marked the right forearm with the word yes and my initials according the hospital's correct site of surgery protocol. I answered all questions by the patient. Patient underwent a supraclavicular block in the preoperative area by the anesthesia service. Patient was then brought to the operating room where she was placed supine on the operative table. She underwent general anesthesia with an LMA.   The right upper extremity was prepped and draped in a sterile fashion. A timeout performed to verify the patient's name, date of birth, medical record number, correct site of surgery correct procedure to be  performed. The timeout was also used a timeout to verify patient received antibiotics and appropriate instruments, implants and radiographs studies were available in the room. Once all in attendance were in agreement case began.   Patient then had the operative extremity exsanguinated with an Esmarch. The tourniquet was placed on the right upper extremity and inflated 250 mm.  A manual reduction of the fracture was performed. The fracture reduction was confirmed on FluoroScan imaging.  A linear incision was then made over the FCR tendon. The subcutaneous tissue was carefully dissected using Metzenbaum scissor and Adson pickup. Retractors were used to protect the radial artery and median nerve. The pronator quadratus was identified and incised and elevated off the volar surface of the distal radius. A standard right 3-hole [employer] volar plate was then positioned on the volar surface of the distal radius. It was held into position with 2 K wires. The position of the plate was confirmed on AP and lateral images. Once the plate was in good position a shaft screw was placed bicortically. This was 12 mm in length. The attention was then turned to the distal pegs. The proximal row of pegs was placed first. Each individual peg hole was drilled and then measured with a depth gauge. The proximal row had threaded pegs placed for fixation. The distal row was then drilled and a combination of threaded and smooth pegs were placed. The position and length of all screws were confirmed on AP and lateral FluoroScan imaging. Care was taken to ensure no peg penetrated through the articular surface of the distal radius.  Once all distal pegs were placed, the attention was turned back to placement of bicortical shaft screws. 2 additional screws,  12 and 14 mm, were placed in the plate, for a total of 3 bicortical shaft screws. The wound was then copiously irrigated. Final FluoroScan imaging of the construct were taken. The  fracture was in anatomic position and the hardware was well-positioned. The wound again was copiously irrigated. The pronator quadratus was repaired with a 0 Vicryl. The subcutaneous tissue was then closed with a 3-0 Vicryl.  The skin was closed with 4-0 Monocryl. Steri-Strips, Xeroform and a dry sterile dressing were applied over the incision along with an AP splint.  A sling was applied to the right upper extremity. I was scrubbed and present for the entire case and all sharp and instrument counts were correct at the conclusion the case. The patient tolerated this procedure well. I spoke with her sister in the postop consultation room to let her know the case had gone without complication and the patient was stable in recovery room.  Patient was stable in recovery room. Her supraclavicular block was working well. She had no pain, sensation or motor function of her hand due to the block.    Erin DevoidKevin L. Kayelee Herbig, MD

## 2016-08-17 NOTE — Anesthesia Procedure Notes (Signed)
Procedure Name: LMA Insertion Performed by: Tylie Golonka Pre-anesthesia Checklist: Patient identified, Patient being monitored, Timeout performed, Emergency Drugs available and Suction available Patient Re-evaluated:Patient Re-evaluated prior to inductionOxygen Delivery Method: Circle system utilized Preoxygenation: Pre-oxygenation with 100% oxygen Intubation Type: IV induction Ventilation: Mask ventilation without difficulty LMA: LMA inserted LMA Size: 3.5 Tube type: Oral Number of attempts: 1 Placement Confirmation: positive ETCO2 and breath sounds checked- equal and bilateral Tube secured with: Tape Dental Injury: Teeth and Oropharynx as per pre-operative assessment        

## 2016-08-17 NOTE — Transfer of Care (Signed)
Immediate Anesthesia Transfer of Care Note  Patient: Erin MartinezSharon G Bastidas  Procedure(s) Performed: Procedure(s): OPEN REDUCTION INTERNAL FIXATION (ORIF) DISTAL RADIAL FRACTURE (Right)  Patient Location: PACU  Anesthesia Type:GA combined with regional for post-op pain  Level of Consciousness: awake, alert  and oriented  Airway & Oxygen Therapy: Patient Spontanous Breathing and Patient connected to face mask oxygen  Post-op Assessment: Report given to RN and Post -op Vital signs reviewed and stable  Post vital signs: Reviewed  Last Vitals:  Vitals:   08/17/16 1100 08/17/16 1309  BP: 133/79 104/61  Pulse: 77 98  Resp: 18 20  Temp:  36.1 C    Last Pain:  Vitals:   08/17/16 1028  TempSrc: Tympanic  PainSc:          Complications: No apparent anesthesia complications

## 2016-08-17 NOTE — OR Nursing (Signed)
Patient transferred to PACU for block, anesthesia reviewed EKG.

## 2016-08-17 NOTE — Progress Notes (Signed)
No complaints of pain   Dressing dry and intact

## 2016-08-17 NOTE — Progress Notes (Signed)
Capillary refill positive to riht hand and sling to right arm

## 2016-08-17 NOTE — H&P (Signed)
PREOPERATIVE H&P  Chief Complaint: S52.531A Colles' fracture of right radius, init for clos fx  HPI: Erin MartinezSharon G Bruce is a 61 y.o. female who presents for preoperative history and physical with a diagnosis of S52.531A Colles' fracture of right radius. The patient sustained the injury to her right wrist last week when she tripped and hit a wall with her right hand trying to save herself from falling. The impact against the hard surface caused the injury. Patient has been confirmed to have a dorsally angulated and displaced distal radius fracture. It is comminuted and does extend into the intra-articular surface without step-off. The decision has been made to proceed with open reduction internal fixation given the patient's high level of functioning at baseline and the displacement of the fracture. Eyes any numbness or tingling in her hand preoperatively.    Past Medical History:  Diagnosis Date  . GERD (gastroesophageal reflux disease)   . Hypertension   . Hypothyroidism   . Thyroid disease    Past Surgical History:  Procedure Laterality Date  . MENISCUS REPAIR Right   . SHOULDER SURGERY Right    x4   Social History   Social History  . Marital status: Single    Spouse name: N/A  . Number of children: N/A  . Years of education: N/A   Social History Main Topics  . Smoking status: Never Smoker  . Smokeless tobacco: Never Used  . Alcohol use 0.0 oz/week     Comment: very rare  . Drug use: No  . Sexual activity: Not Asked   Other Topics Concern  . None   Social History Narrative  . None   History reviewed. No pertinent family history. Allergies  Allergen Reactions  . Alendronate     Other reaction(s): Other (See Comments) Dyspepsia/esophagitis  . Buspirone     Other reaction(s): Other (See Comments) confusion  . Diltiazem     Other reaction(s): Other (See Comments) Leg edema  . Losartan     tachycardia  . Tramadol Palpitations   Prior to Admission medications    Medication Sig Start Date End Date Taking? Authorizing Provider  enalapril (VASOTEC) 10 MG tablet Take 10 mg by mouth every morning.  05/12/15 08/17/16 Yes [provider]  HYDROcodone-acetaminophen (NORCO/VICODIN) 5-325 MG tablet Take 1-2 tablets by mouth every 6 (six) hours as needed.  05/27/15  Yes [provider]  levothyroxine (SYNTHROID, LEVOTHROID) 150 MCG tablet Take 150 mcg by mouth daily before breakfast.   Yes [provider]  losartan (COZAAR) 50 MG tablet  06/23/15  Yes [provider]  Multiple Vitamin (MULTIVITAMIN) tablet Take 1 tablet by mouth daily.   Yes [provider]  omeprazole (PRILOSEC) 40 MG capsule Take 40 mg by mouth every morning.  06/23/15  Yes [provider]  tiZANidine (ZANAFLEX) 4 MG tablet Take 4 mg by mouth every 6 (six) hours as needed.  06/23/15  Yes [provider]  zolpidem (AMBIEN CR) 6.25 MG CR tablet Take 6.25 mg by mouth at bedtime as needed for sleep.   Yes [provider]  amoxicillin-clavulanate (AUGMENTIN) 875-125 MG tablet Take 1 tablet by mouth 2 (two) times daily. Patient not taking: Reported on 08/13/2016 06/21/16   Junie SpencerHawks, Christy A, FNP  ARMOUR THYROID 15 MG tablet  05/20/15   [provider]     Positive ROS: All other systems have been reviewed and were otherwise negative with the exception of those mentioned in the HPI and as above.  Physical  Exam: General: Alert, no acute distress Cardiovascular: Regular rate and rhythm, no murmurs rubs or gallops.  No pedal edema Respiratory: Clear to auscultation bilaterally, no wheezes rales or rhonchi. No cyanosis, no use of accessory musculature GI: No organomegaly, abdomen is soft and non-tender nondistended with positive bowel sounds. Skin: Skin intact, no lesions within the operative field. Neurologic: Sensation intact distally Psychiatric: Patient is competent for consent with normal mood and affect Lymphatic: No cervical  lymphadenopathy  MUSCULOSKELETAL: Right upper extremity: Patient has a volar splint in place. He has mild swelling of her fingers and proximal to the splint. Her fingers are well-perfused. She has intact sensation light touch in all 5 digits and can actively flex and extend of her digits.  Assessment: S52.531A Colles' fracture of right radius  Plan: Plan for Procedure(s): OPEN REDUCTION INTERNAL FIXATION (ORIF) RIGHT DISTAL RADIAL FRACTURE  I discussed the details of the operation with the patient and her sister in the preoperative area. We discussed the postoperative course which will entail approximately week of splinting before she returns to the office. Patient will then be enrolled in occupational therapy. Bone will take 6-8 weeks to heal.  I discussed the risks and benefits of surgery with the patient. She understands the risks include but are not limited to infection, bleeding requiring blood transfusion, nerve or blood vessel injury, joint stiffness or loss of motion, persistent pain, weakness or instability, malunion, nonunion and hardware failure and the need for further surgery. Medical risks include but are not limited to DVT and pulmonary embolism, myocardial infarction, stroke, pneumonia, respiratory failure and death. Patient understood these risks and wished to proceed.     Juanell Fairly, MD   08/17/2016 10:48 AM

## 2016-08-17 NOTE — Anesthesia Post-op Follow-up Note (Cosign Needed)
Anesthesia QCDR form completed.        

## 2016-08-17 NOTE — Anesthesia Procedure Notes (Addendum)
Anesthesia Regional Block: Supraclavicular block   Pre-Anesthetic Checklist: ,, timeout performed, Correct Patient, Correct Site, Correct Laterality, Correct Procedure, Correct Position, site marked, Risks and benefits discussed,  Surgical consent,  Pre-op evaluation,  At surgeon's request and post-op pain management  Laterality: Upper and Right  Prep: chloraprep       Needles:  Injection technique: Single-shot  Needle Type: Stimiplex     Needle Length: 5cm  Needle Gauge: 22     Additional Needles:   Procedures: ultrasound guided,,,,,,,,  Narrative:  Start time: 08/17/2016 10:51 AM End time: 08/17/2016 10:58 AM Injection made incrementally with aspirations every 5 mL.  Performed by: Personally  Anesthesiologist: Margorie JohnPISCITELLO, Joslyn Ramos K  Additional Notes: Functioning IV was confirmed and monitors were applied.  A 50mm 22ga Stimuplex needle was used. Sterile prep,hand hygiene and sterile gloves were used.  Patient did endorse 1 paresthesia with initial needle placement that resoled with retraction and redirection of block needle. No paresthesia endorsed by patient during injection.  Negative aspiration and negative test dose prior to incremental administration of local anesthetic. The patient tolerated the procedure well with no immediate complications.

## 2016-08-17 NOTE — Anesthesia Preprocedure Evaluation (Signed)
Anesthesia Evaluation  Patient identified by MRN, date of birth, ID band Patient awake    Reviewed: Allergy & Precautions, H&P , NPO status , Patient's Chart, lab work & pertinent test results  History of Anesthesia Complications Negative for: history of anesthetic complications  Airway Mallampati: III  TM Distance: <3 FB Neck ROM: limited    Dental  (+) Chipped   Pulmonary neg pulmonary ROS, neg shortness of breath,    Pulmonary exam normal breath sounds clear to auscultation       Cardiovascular Exercise Tolerance: Good hypertension, (-) angina(-) Past MI and (-) DOE Normal cardiovascular exam Rhythm:regular Rate:Normal     Neuro/Psych negative neurological ROS  negative psych ROS   GI/Hepatic Neg liver ROS, GERD  Medicated and Controlled,  Endo/Other  Hypothyroidism   Renal/GU      Musculoskeletal   Abdominal   Peds  Hematology negative hematology ROS (+)   Anesthesia Other Findings Past Medical History: No date: GERD (gastroesophageal reflux disease) No date: Hypertension No date: Hypothyroidism No date: Thyroid disease  Past Surgical History: No date: MENISCUS REPAIR Right No date: SHOULDER SURGERY Right     Comment: x4  BMI    Body Mass Index:  22.87 kg/m      Reproductive/Obstetrics negative OB ROS                             Anesthesia Physical Anesthesia Plan  ASA: III  Anesthesia Plan: General LMA and Regional   Post-op Pain Management:  Regional for Post-op pain   Induction: Intravenous  Airway Management Planned: LMA  Additional Equipment:   Intra-op Plan:   Post-operative Plan: Extubation in OR  Informed Consent: I have reviewed the patients History and Physical, chart, labs and discussed the procedure including the risks, benefits and alternatives for the proposed anesthesia with the patient or authorized representative who has indicated his/her  understanding and acceptance.   Dental Advisory Given  Plan Discussed with: Anesthesiologist, CRNA and Surgeon  Anesthesia Plan Comments: (Patient consented for risks of anesthesia including but not limited to:  - adverse reactions to medications - damage to teeth, lips or other oral mucosa - sore throat or hoarseness - Damage to heart, brain, lungs or loss of life  Patient voiced understanding.)        Anesthesia Quick Evaluation

## 2016-08-23 DIAGNOSIS — S52531D Colles' fracture of right radius, subsequent encounter for closed fracture with routine healing: Secondary | ICD-10-CM | POA: Diagnosis not present

## 2016-08-31 ENCOUNTER — Encounter: Payer: Self-pay | Admitting: Occupational Therapy

## 2016-08-31 ENCOUNTER — Ambulatory Visit: Payer: 59 | Attending: Orthopedic Surgery | Admitting: Occupational Therapy

## 2016-08-31 DIAGNOSIS — M25531 Pain in right wrist: Secondary | ICD-10-CM | POA: Insufficient documentation

## 2016-08-31 DIAGNOSIS — M25631 Stiffness of right wrist, not elsewhere classified: Secondary | ICD-10-CM | POA: Diagnosis not present

## 2016-08-31 DIAGNOSIS — M6281 Muscle weakness (generalized): Secondary | ICD-10-CM | POA: Insufficient documentation

## 2016-08-31 DIAGNOSIS — R208 Other disturbances of skin sensation: Secondary | ICD-10-CM | POA: Insufficient documentation

## 2016-08-31 NOTE — Therapy (Signed)
[place] [facility] [address] [place], [address] Phone: [phone]   Fax:  [phone]  Occupational Therapy Evaluation  Patient Details  Name: Erin Bruce MRN: [id number] Date of Birth: [birth year] Referring Provider: Martha Clan   Encounter Date: 08/31/2016      OT End of Session - 08/31/16 1153    Visit Number 1   Number of Visits 8   Date for OT Re-Evaluation 10/12/16   OT Start Time 1044   OT Stop Time 1137   OT Time Calculation (min) 53 min   Activity Tolerance Patient tolerated treatment well   Behavior During Therapy [facility] for tasks assessed/performed      Past Medical History:  Diagnosis Date  . GERD (gastroesophageal reflux disease)   . Hypertension   . Hypothyroidism   . Thyroid disease     Past Surgical History:  Procedure Laterality Date  . MENISCUS REPAIR Right   . OPEN REDUCTION INTERNAL FIXATION (ORIF) DISTAL RADIAL FRACTURE Right 08/17/2016   Procedure: OPEN REDUCTION INTERNAL FIXATION (ORIF) DISTAL RADIAL FRACTURE;  Surgeon: Juanell Fairly, MD;  Location: [facility] ORS;  Service: Orthopedics;  Laterality: Right;  . SHOULDER SURGERY Right    x4    There were no vitals filed for this visit.      Subjective Assessment - 08/31/16 1045    Subjective  Fell on 5/2 and had surgery on 5/8- soft cast and then internal stitches - next follow up Dr Kirtland Bouchard this Thursday - numbness since surgery in thumb and volar wrist - come and goes    Currently in Pain? No/denies   Pain Score 0-No pain   Pain Orientation Right   Pain Descriptors / Indicators Aching;Throbbing  night time    Pain Type Acute pain;Surgical pain           [facility] OT Assessment - 08/31/16 0001      Assessment   Diagnosis R colles fx -    Referring Provider Martha Clan    Onset Date 08/17/16     Balance Screen   Has the patient fallen in the past 6 months Yes   How many times? 1   Has the patient had a decrease in activity  level because of a fear of falling?  No   Is the patient reluctant to leave their home because of a fear of falling?  No     Home  Environment   Lives With Family     Prior Function   Vocation Full time employment   Leisure RN - R hand dominant - gardening , yard work , reading , sodoku, games on phone and tablet      Edema   Edema R wrist increase by 2.5 cm      AROM   Right Forearm Pronation 90 Degrees   Right Forearm Supination 75 Degrees   Right Wrist Extension 55 Degrees   Right Wrist Flexion 35 Degrees   Right Wrist Radial Deviation 20 Degrees   Right Wrist Ulnar Deviation 20 Degrees   Left Wrist Extension 70 Degrees   Left Wrist Flexion 90 Degrees   Left Wrist Radial Deviation 20 Degrees   Left Wrist Ulnar Deviation 33 Degrees     Right Hand AROM   R Thumb Radial ABduction/ADduction 0-55 48   R Thumb Palmar ABduction/ADduction 0-45 60   R Thumb Opposition to Index --  base of 5th with pain    R Index  MCP 0-90 75 Degrees  R Index PIP 0-100 100 Degrees   R Long  MCP 0-90 80 Degrees   R Long PIP 0-100 100 Degrees   R Ring  MCP 0-90 80 Degrees   R Ring PIP 0-100 100 Degrees   R Little  MCP 0-90 90 Degrees   R Little PIP 0-100 100 Degrees     Left Hand AROM   L Thumb Radial ADduction/ABduction 0-55 45   L Thumb Palmar ADduction/ABduction 0-45 58      Contrast 3 x day  isotoner glove to wear as needed   AROM tendon glides  Opposition AROM  Wrist AROM  In all planes  Pain about 1/10 - slight pull  Med N  Glides - 5 reps  2 x day                      OT Education - 08/31/16 1153    Education provided Yes   Education Details Findings and HEP   Person(s) Educated Patient   Methods Explanation;Demonstration;Tactile cues;Verbal cues;Handout   Comprehension Verbal cues required;Returned demonstration;Verbalized understanding          OT Short Term Goals - 08/31/16 1158      OT SHORT TERM GOAL #1   Title Pain on PRWHE improve with at  least 15 points    Baseline at eval PRWHE  37/50 for pain   Time 2   Period Weeks   Status New     OT SHORT TERM GOAL #2   Title Edema in R wrist and scar adhesion improve for pt to show increase AROM at wrist and decrease pain at night time to less than 3/10   Baseline pain at night time 8-9/10 and edema at wrist more than 2 1/2 cm - scar swollen and some what red - to hold off on scar massage this date    Time 3   Period Weeks   Status New           OT Long Term Goals - 08/31/16 1200      OT LONG TERM GOAL #1   Title R wrist AROM improve in all planes to [facility] to turn doorknob, use in bathing and dressing, house hold   more than 75 %    Baseline see flowsheet    Time 4   Period Weeks   Status New     OT LONG TERM GOAL #2   Title R grip strength increase to more than 50% compare to L to carry more than 5lbs and cut food, do some yard work   Baseline Grip NT - 2 1/2 wks s/p   Time 6   Period Weeks     OT LONG TERM GOAL #3   Title PRehension strenght in R hand increase to more than 50% cmpare to L to cut food , hold plate and do buttons without increase symptoms    Baseline NT - 2 1/2 wks s/p   Time 6   Period Weeks   Status New     OT LONG TERM GOAL #4   Title Function score on PRWHE improve more than 20 points to return to work   Baseline Function at eval on PRWHE 45/50   Time 6   Period Weeks   Status New               Plan - 08/31/16 1154    Clinical Impression Statement Pt present 2 1/2 wks s/p ORIF colles Fracture -  distal radius - pt in prefab wrist splint - pt present with numbness/pins and needles- CT symptoms - but pt had increase edema of more than 2 1/2 cm at wrist - pt report pain increase to 8-9/10 at night time - pt to do contrast and educated her on correct HEP - pt do show decrease AROM in wrist in all planes - will benefit from scar managments , edema controls increase ROM and strnength as per protocol     Rehab Potential Good   Clinical  Impairments Affecting Rehab Potential CT symptoms - but could be increase edema - pt report overdoing thiings    OT Frequency 2x / week   OT Duration 6 weeks   OT Treatment/Interventions Self-care/ADL training;Splinting;Patient/family education;Therapeutic exercises;Contrast Bath;Scar mobilization;Passive range of motion;Manual Therapy;Parrafin   Plan assess edema and CT symptoms decrease - pain decrease and ROM    OT Home Exercise Plan see pt instructions   Consulted and Agree with Plan of Care Patient      Patient will benefit from skilled therapeutic intervention in order to improve the following deficits and impairments:  Decreased coordination, Decreased range of motion, Impaired flexibility, Impaired sensation, Increased edema, Decreased knowledge of precautions, Decreased strength, Impaired UE functional use, Pain  Visit Diagnosis: Pain in right wrist - Plan: Ot plan of care cert/re-cert  Stiffness of right wrist, not elsewhere classified - Plan: Ot plan of care cert/re-cert  Other disturbances of skin sensation - Plan: Ot plan of care cert/re-cert  Muscle weakness (generalized) - Plan: Ot plan of care cert/re-cert    Problem List There are no active problems to display for this patient.   Oletta Cohn OTR/L,CLT 08/31/2016, 12:10 PM  [place] [facility] SPORTS [facility] [address] [place], [address] Phone: [phone]   Fax:  [phone]  Name: Erin Bruce MRN: [id number] Date of Birth: September 20, 1955

## 2016-08-31 NOTE — Patient Instructions (Signed)
Contrast 3 x day  isotoner glove to wear as needed   AROM tendon glides  Opposition AROM  Wrist AROM  In all planes  Pain about 1/10 - slight pull  Med N  Glides - 5 reps  2 x day

## 2016-09-02 DIAGNOSIS — S52531D Colles' fracture of right radius, subsequent encounter for closed fracture with routine healing: Secondary | ICD-10-CM | POA: Diagnosis not present

## 2016-09-08 ENCOUNTER — Ambulatory Visit: Payer: 59 | Admitting: Occupational Therapy

## 2016-09-13 DIAGNOSIS — S52531D Colles' fracture of right radius, subsequent encounter for closed fracture with routine healing: Secondary | ICD-10-CM | POA: Diagnosis not present

## 2016-09-14 ENCOUNTER — Ambulatory Visit: Payer: 59 | Attending: Orthopedic Surgery | Admitting: Occupational Therapy

## 2016-09-14 DIAGNOSIS — M6281 Muscle weakness (generalized): Secondary | ICD-10-CM | POA: Diagnosis not present

## 2016-09-14 DIAGNOSIS — M25631 Stiffness of right wrist, not elsewhere classified: Secondary | ICD-10-CM | POA: Insufficient documentation

## 2016-09-14 DIAGNOSIS — M25531 Pain in right wrist: Secondary | ICD-10-CM | POA: Insufficient documentation

## 2016-09-14 DIAGNOSIS — R208 Other disturbances of skin sensation: Secondary | ICD-10-CM | POA: Diagnosis not present

## 2016-09-14 NOTE — Patient Instructions (Addendum)
   Cont with isotoner glove to wear as needed  Pt fitted with neoprene Benik splint and wrist splint  alternate 2 hrs on - only  Off with splint when sitting  Can increase to 3-4 hrs neoprene in 3-4 days if pain stays below 2/10  - and 2 hrs wrist splint   Scar massage and mobs done - pt ed and hand out provided  cica scar pad provided and fitted to wear at night time   AROM tendon glides  Opposition AROM  Wrist AROM  In all planes  Pain should be about 1/10  Med N  Glides - 5 reps  2-3 x day

## 2016-09-14 NOTE — Therapy (Signed)
[place] [facility] MEDICINE [address] [place], [place], [address] Phone: [phone]   Fax:  [phone]  Occupational Therapy Treatment  Patient Details  Name: Alfredo MartinezSharon G Carline MRN: [id number] Date of Birth: 05-May-1955 Referring Provider: Martha ClanKrasinski   Encounter Date: 09/14/2016      OT End of Session - 09/14/16 1107    Visit Number 2   Date for OT Re-Evaluation 10/12/16   OT Start Time 1050   OT Stop Time 1135   OT Time Calculation (min) 45 min   Activity Tolerance Patient tolerated treatment well   Behavior During Therapy [facility] for tasks assessed/performed      Past Medical History:  Diagnosis Date  . GERD (gastroesophageal reflux disease)   . Hypertension   . Hypothyroidism   . Thyroid disease     Past Surgical History:  Procedure Laterality Date  . MENISCUS REPAIR Right   . OPEN REDUCTION INTERNAL FIXATION (ORIF) DISTAL RADIAL FRACTURE Right 08/17/2016   Procedure: OPEN REDUCTION INTERNAL FIXATION (ORIF) DISTAL RADIAL FRACTURE;  Surgeon: Juanell FairlyKrasinski, Kevin, MD;  Location: [facility] ORS;  Service: Orthopedics;  Laterality: Right;  . SHOULDER SURGERY Right    x4    There were no vitals filed for this visit.      Subjective Assessment - 09/14/16 1056    Subjective  Numbness better- seen Dr Kirtland BouchardK - out of work until July 1st - taking of splint my whole arm ache up to the shoulder - I cannot go without splint - doing okay with exercises - just under 4 wks out from surgery    Patient Stated Goals Want to get my arm better to return to work and doing the things I love - I am R hand    Currently in Pain? Yes   Pain Score 7    Pain Location Arm   Pain Orientation Right   Pain Descriptors / Indicators Aching;Throbbing   Pain Type Acute pain   Pain Onset 1 to 4 weeks ago   Pain Frequency Constant            [facility] OT Assessment - 09/14/16 0001      AROM   Right Forearm Supination 75 Degrees   Right Wrist Extension 60 Degrees    Right Wrist Flexion 50 Degrees   Right Wrist Radial Deviation 30 Degrees   Right Wrist Ulnar Deviation 25 Degrees                  OT Treatments/Exercises (OP) - 09/14/16 0001      RUE Fluidotherapy   Number Minutes Fluidotherapy 14 Minutes   RUE Fluidotherapy Location Hand;Wrist;Forearm   Comments AT SOC to decrease pain - alternate with ice for min at 10 min - cont again for 2 min  - pain decrease       Measurements taken - see flowsheet Pt show increase pain in arm to 7/10 fluido therapy done - to decrease pain and increase ROM at wrist prior to ROM  See flowsheet - done some heat to shoulder and ice after 10 min and repeat fluido for another 2 min Pain decrease to 0/10   Cont with isotoner glove to wear as needed  Pt fitted with neoprene Benik splint and wrist splint  alternate 2 hrs on - only  Off with splint when sitting  Can increase to 3-4 hrs neoprene in 3-4 days if pain stays below 2/10  - and 2 hrs wrist splint   Scar  massage and mobs done - pt ed and hand out provided  cica scar pad provided and fitted to wear at night time   AROM tendon glides  Opposition AROM  Wrist AROM  In all planes  Pain should be about 1/10  Med N  Glides - 5 reps  2-3 x day              OT Education - 09/14/16 1106    Education provided Yes   Education Details HEP and splint wearing    Person(s) Educated Patient   Methods Explanation;Tactile cues;Verbal cues;Handout   Comprehension Verbalized understanding;Returned demonstration;Verbal cues required          OT Short Term Goals - 08/31/16 1158      OT SHORT TERM GOAL #1   Title Pain on PRWHE improve with at least 15 points    Baseline at eval PRWHE  37/50 for pain   Time 2   Period Weeks   Status New     OT SHORT TERM GOAL #2   Title Edema in R wrist and scar adhesion improve for pt to show increase AROM at wrist and decrease pain at night time to less than 3/10   Baseline pain at night time 8-9/10  and edema at wrist more than 2 1/2 cm - scar swollen and some what red - to hold off on scar massage this date    Time 3   Period Weeks   Status New           OT Long Term Goals - 08/31/16 1200      OT LONG TERM GOAL #1   Title R wrist AROM improve in all planes to [facility] to turn doorknob, use in bathing and dressing, house hold   more than 75 %    Baseline see flowsheet    Time 4   Period Weeks   Status New     OT LONG TERM GOAL #2   Title R grip strength increase to more than 50% compare to L to carry more than 5lbs and cut food, do some yard work   Baseline Grip NT - 2 1/2 wks s/p   Time 6   Period Weeks     OT LONG TERM GOAL #3   Title PRehension strenght in R hand increase to more than 50% cmpare to L to cut food , hold plate and do buttons without increase symptoms    Baseline NT - 2 1/2 wks s/p   Time 6   Period Weeks   Status New     OT LONG TERM GOAL #4   Title Function score on PRWHE improve more than 20 points to return to work   Baseline Function at eval on PRWHE 45/50   Time 6   Period Weeks   Status New               Plan - 09/14/16 1107    Clinical Impression Statement Pt arrive with pain in R dominant arm 7/10 - pt unable to tolerate going from splint on 100% to nothing - pt to wean gradually 2hrs on and off into neoprene and then will assess going next week  gradually in  no splint - pt to keep pain under 2/10  - pt only month our from s/p this Friday - pt making great proress in AROM - but will intiate at 5 wks PROM - and then strengthening -  per protocol for  surgery - scar  massage and mobs intiiated this date    Occupational performance deficits (Please refer to evaluation for details): ADL's;IADL's;Rest and Sleep;Work;Play;Leisure   Current Impairments/barriers affecting progress: CT symptoms - but could be increase edema -   OT Frequency 2x / week   OT Duration 6 weeks   OT Treatment/Interventions Self-care/ADL  training;Splinting;Patient/family education;Therapeutic exercises;Contrast Bath;Scar mobilization;Passive range of motion;Manual Therapy;Parrafin   Plan assess how doing weaning into neoprene splint - pain ? and if can intiate PROM - assess scar ?   OT Home Exercise Plan see pt instructions   Consulted and Agree with Plan of Care Patient      Patient will benefit from skilled therapeutic intervention in order to improve the following deficits and impairments:  Decreased coordination, Decreased range of motion, Impaired flexibility, Impaired sensation, Increased edema, Decreased knowledge of precautions, Decreased strength, Impaired UE functional use, Pain  Visit Diagnosis: Pain in right wrist  Stiffness of right wrist, not elsewhere classified  Other disturbances of skin sensation  Muscle weakness (generalized)    Problem List There are no active problems to display for this patient.   Oletta Cohn OTR/L,CLT 09/14/2016, 12:42 PM  [place] [facility] PHYSICAL AND SPORTS MEDICINE [address] [place], [address] Phone: [phone]   Fax:  [phone]  Name: KIMORI TARTAGLIA MRN: [id number] Date of Birth: 26-Jul-1955

## 2016-09-20 ENCOUNTER — Ambulatory Visit: Payer: 59 | Admitting: Occupational Therapy

## 2016-09-20 DIAGNOSIS — R208 Other disturbances of skin sensation: Secondary | ICD-10-CM | POA: Diagnosis not present

## 2016-09-20 DIAGNOSIS — M25531 Pain in right wrist: Secondary | ICD-10-CM

## 2016-09-20 DIAGNOSIS — M25631 Stiffness of right wrist, not elsewhere classified: Secondary | ICD-10-CM

## 2016-09-20 DIAGNOSIS — M6281 Muscle weakness (generalized): Secondary | ICD-10-CM

## 2016-09-20 NOTE — Patient Instructions (Signed)
Add to HEP  Taped scar with kinesiotape - 30% either side of scar   and 3 across at 100% pull to decrease adhesion and increase extention and flexion of wrist     PROM for wrist extention and flexion over edge of table add  And prayer stretch and PROM for wrist flexion  To be done prior to AROM - slight pull - gentle  Did add hammer 16oz for sup/pro - with resting inbetween - no increase pain  ( 1 x day )

## 2016-09-20 NOTE — Therapy (Signed)
[facility] [facility] PH[facility]IC[facility] [address] [place], [place], [address] Phone: [phone]   Fax:  [phone]  Occupational Therapy Treatment  Patient Details  Name: Erin MartinezSharon G Bartel MRN: [id number] Date of Birth: 04/05/1956 Referring Provider: Martha ClanKrasinski   Encounter Date: 09/20/2016      OT End of Session - 09/20/16 1748    Visit Number 3   Number of Visits 8   Date for OT Re-Evaluation 10/12/16   OT Start Time 1503   OT Stop Time 1547   OT Time Calculation (min) 44 min   Activity Tolerance Patient tolerated treatment well   Behavior During Therapy [facility] for tasks assessed/performed      Past Medical History:  Diagnosis Date  . GERD (gastroesophageal reflux disease)   . Hypertension   . Hypothyroidism   . Thyroid disease     Past Surgical History:  Procedure Laterality Date  . MENISCUS REPAIR Right   . OPEN REDUCTION INTERNAL FIXATION (ORIF) DISTAL RADIAL FRACTURE Right 08/17/2016   Procedure: OPEN REDUCTION INTERNAL FIXATION (ORIF) DISTAL RADIAL FRACTURE;  Surgeon: Juanell FairlyKrasinski, Kevin, MD;  Location: [facility] ORS;  Service: Orthopedics;  Laterality: Right;  . SHOULDER SURGERY Right    x4    There were no vitals filed for this visit.      Subjective Assessment - 09/20/16 1746    Subjective  I am probably using my hand to much and doing to much - in yard , laundry and cleaning house and on computer - my forearm and top of hand 4-5/10 - wearing soft splint 50% of time during day and hard at night time    Patient Stated Goals Want to get my arm better to return to work and doing the things I love - I am R hand    Currently in Pain? Yes   Pain Score 5    Pain Location Arm   Pain Orientation Right   Pain Descriptors / Indicators Aching   Pain Type Surgical pain   Pain Onset 1 to 4 weeks ago            [facility] Assessment - 09/20/16 0001      AROM   Right Wrist Extension 65 Degrees   Right Wrist Flexion 64 Degrees                   OT Treatments/Exercises (OP) - 09/20/16 0001      RUE Fluidotherapy   Number Minutes Fluidotherapy 10 Minutes   RUE Fluidotherapy Location Hand;Wrist   [facility] to decrease pain down to 1/10 , increase ROM     Measurements for AROM for wrist  taken - see flowsheet  fluido therapy done - to decrease pain and increase ROM at wrist prior to ROM   3-4 hrs neoprene during day - if pain under 2/10   Scar massage and mobs done - pt ed and hand out provided - graston tool nr 2 used for brushing and sweeping  Taped scar with kinesiotape - 30% either side of scar   and 3 across at 100% pull to decrease adhesion and increase extention and flexion of wrist   AROM tendon glides  Opposition AROM  PROM for wrist extention and flexion over edge of table add  And prayer stretch and PROM for wrist flexion  To be done prior to AROM - slight pull - gentle Wrist AROM In all planes  Pain should be about 1/10  Med  N Glides - 5 reps to cont with  Did add hammer 16oz for sup/pro - with resting inbetween - no increase pain  ( 1 x day )              OT Education - 09/20/16 1747    Education provided Yes   Education Details HEP , modifications to decrease pain - and over use of extensors , scar mobs    Person(s) Educated Patient   Methods Explanation;Demonstration;Tactile cues;Verbal cues;Handout   Comprehension Verbal cues required;Returned demonstration;Verbalized understanding          OT Short Term Goals - 08/31/16 1158      OT SHORT TERM GOAL #1   Title Pain on PRWHE improve with at least 15 points    Baseline at eval PRWHE  37/50 for pain   Time 2   Period Weeks   Status New     OT SHORT TERM GOAL #2   Title Edema in R wrist and scar adhesion improve for pt to show increase AROM at wrist and decrease pain at night time to less than 3/10   Baseline pain at night time 8-9/10 and edema at wrist more than 2 1/2 cm - scar swollen and some what  red - to hold off on scar massage this date    Time 3   Period Weeks   Status New           OT Long Term Goals - 08/31/16 1200      OT LONG TERM GOAL #1   Title R wrist AROM improve in all planes to [facility] to turn doorknob, use in bathing and dressing, house hold   more than 75 %    Baseline see flowsheet    Time 4   Period Weeks   Status New     OT LONG TERM GOAL #2   Title R grip strength increase to more than 50% compare to L to carry more than 5lbs and cut food, do some yard work   Baseline Grip NT - 2 1/2 wks s/p   Time 6   Period Weeks     OT LONG TERM GOAL #3   Title PRehension strenght in R hand increase to more than 50% cmpare to L to cut food , hold plate and do buttons without increase symptoms    Baseline NT - 2 1/2 wks s/p   Time 6   Period Weeks   Status New     OT LONG TERM GOAL #4   Title Function score on PRWHE improve more than 20 points to return to work   Baseline Function at eval on PRWHE 45/50   Time 6   Period Weeks   Status New               Plan - 09/20/16 1749    Clinical Impression Statement Pt cont to make progress in AROM at R wrist - but pain still increase , pt using hand and over using extensors - fighthing scar and tightness in flexors - pt to use palms and keep objects under 2 lbs - decrease pain at rest down to  1/10 - kinesiotape applied this date for scar adhesion and pt ed on precautions    Occupational performance deficits (Please refer to evaluation for details): ADL's;IADL's;Rest and Sleep;Work;Play;Leisure   Rehab Potential Good   Current Impairments/barriers affecting progress: CT symptoms - but could be increase edema at eval    OT Frequency 2x /  week   OT Duration 4 weeks   Plan assess pain with adding some PROM , assess scar using kinesiotape    Clinical Decision Making Several treatment options, min-mod task modification necessary   OT Home Exercise Plan see pt instructions   Consulted and Agree with Plan of Care  Patient      Patient will benefit from skilled therapeutic intervention in order to improve the following deficits and impairments:  Decreased coordination, Decreased range of motion, Impaired flexibility, Impaired sensation, Increased edema, Decreased knowledge of precautions, Decreased strength, Impaired UE functional use, Pain  Visit Diagnosis: Pain in right wrist  Stiffness of right wrist, not elsewhere classified  Other disturbances of skin sensation  Muscle weakness (generalized)    Problem List There are no active problems to display for this patient.   Oletta Cohn OTR/L,CLT 09/20/2016, 5:59 PM  [place] [facility] PHYSICAL [facility] SPORTS MEDICINE [address] [place], [address] Phone: [phone]   Fax:  [phone]  Name: MARYSUE FAIT MRN: [id number] Date of Birth: 1955/07/03

## 2016-09-23 ENCOUNTER — Ambulatory Visit: Payer: 59 | Admitting: Occupational Therapy

## 2016-09-23 DIAGNOSIS — R208 Other disturbances of skin sensation: Secondary | ICD-10-CM | POA: Diagnosis not present

## 2016-09-23 DIAGNOSIS — M6281 Muscle weakness (generalized): Secondary | ICD-10-CM | POA: Diagnosis not present

## 2016-09-23 DIAGNOSIS — M25531 Pain in right wrist: Secondary | ICD-10-CM

## 2016-09-23 DIAGNOSIS — M25631 Stiffness of right wrist, not elsewhere classified: Secondary | ICD-10-CM

## 2016-09-23 NOTE — Therapy (Signed)
[facility]TS MEDICINE [address] [place], [address] Phone: [phone]   Fax:  [phone]  Occupational Therapy Treatment  Patient Details  Name: Erin Bruce MRN: [id number] Date of Birth: 21-Jul-1955 Referring Provider: Martha Clan   Encounter Date: 09/23/2016      OT End of Session - 09/23/16 1224    Visit Number 4   Number of Visits 8   Date for OT Re-Evaluation 10/12/16   OT Start Time 1115   OT Stop Time 1159   OT Time Calculation (min) 44 min   Activity Tolerance Patient tolerated treatment well   Behavior During Therapy [facility] for tasks assessed/performed      Past Medical History:  Diagnosis Date  . GERD (gastroesophageal reflux disease)   . Hypertension   . Hypothyroidism   . Thyroid disease     Past Surgical History:  Procedure Laterality Date  . MENISCUS REPAIR Right   . OPEN REDUCTION INTERNAL FIXATION (ORIF) DISTAL RADIAL FRACTURE Right 08/17/2016   Procedure: OPEN REDUCTION INTERNAL FIXATION (ORIF) DISTAL RADIAL FRACTURE;  Surgeon: Juanell Fairly, MD;  Location: [facility] ORS;  Service: Orthopedics;  Laterality: Right;  . SHOULDER SURGERY Right    x4    There were no vitals filed for this visit.      Subjective Assessment - 09/23/16 1221    Subjective  Doing okay - with hammer exercises - some pain at wrist - but better than it was - using it more    Patient Stated Goals Want to get my arm better to return to work and doing the things I love - I am R hand    Currently in Pain? Yes   Pain Score 2    Pain Location Wrist   Pain Orientation Right   Pain Descriptors / Indicators Aching            [facility] OT Assessment - 09/23/16 0001      Strength   Right Hand Grip (lbs) 41   Right Hand Lateral Pinch 10 lbs   Right Hand 3 Point Pinch 13 lbs   Left Hand Grip (lbs) 46   Left Hand Lateral Pinch 13.5 lbs   Left Hand 3 Point Pinch 16 lbs                  OT  Treatments/Exercises (OP) - 09/23/16 0001      RUE Fluidotherapy   Number Minutes Fluidotherapy 10 Minutes   RUE Fluidotherapy Location Hand;Wrist   Comments at [facility] to increase wrist flexion and ROM       Measurements for AROM for wrist  taken - see flowsheet And grip and prehension strength   fluido therapy done -increase ROM at wrist prior to ROM     Scar massage and mobs done - graston tool nr 2 used for brushing and sweeping  And vibration done Taped scar with kinesiotape - 30% either side of scar   and 3 across at 100% pull to decrease adhesion and increase extention and flexion of wrist xtras provided for home use this week    PROM for wrist extention and flexion over edge of table  And prayer stretch and PROM for wrist flexion  To be done prior to AROM - slight pull - gentle  hammer 16oz for sup/pro, RD and UD , wrist extention and flexion   12 reps   2-3 x day   can increase in 3 days to 2 sets if  not increase pain   as if needed in 6 days 3 sets - but pain free  Light blue putty for grip , lat and 3 point grip - no pain  12 reps   1 set 2 x day  3 days 2 sets if no pain             OT Education - 09/23/16 1224    Education provided Yes   Education Details HEP - upgrade add putty and weight for wrist    Person(s) Educated Patient   Methods Explanation;Demonstration;Tactile cues;Verbal cues;Handout   Comprehension Verbal cues required;Returned demonstration;Verbalized understanding          OT Short Term Goals - 08/31/16 1158      OT SHORT TERM GOAL #1   Title Pain on PRWHE improve with at least 15 points    Baseline at eval PRWHE  37/50 for pain   Time 2   Period Weeks   Status New     OT SHORT TERM GOAL #2   Title Edema in R wrist and scar adhesion improve for pt to show increase AROM at wrist and decrease pain at night time to less than 3/10   Baseline pain at night time 8-9/10 and edema at wrist more than 2 1/2 cm - scar swollen and some  what red - to hold off on scar massage this date    Time 3   Period Weeks   Status New           OT Long Term Goals - 08/31/16 1200      OT LONG TERM GOAL #1   Title R wrist AROM improve in all planes to [facility] to turn doorknob, use in bathing and dressing, house hold   more than 75 %    Baseline see flowsheet    Time 4   Period Weeks   Status New     OT LONG TERM GOAL #2   Title R grip strength increase to more than 50% compare to L to carry more than 5lbs and cut food, do some yard work   Baseline Grip NT - 2 1/2 wks s/p   Time 6   Period Weeks     OT LONG TERM GOAL #3   Title PRehension strenght in R hand increase to more than 50% cmpare to L to cut food , hold plate and do buttons without increase symptoms    Baseline NT - 2 1/2 wks s/p   Time 6   Period Weeks   Status New     OT LONG TERM GOAL #4   Title Function score on PRWHE improve more than 20 points to return to work   Baseline Function at eval on PRWHE 45/50   Time 6   Period Weeks   Status New               Plan - 09/23/16 1224    Clinical Impression Statement Pt cont to make progress in pain , ROM , functional use - cont scar tissue adhere , kinesiotape done this date - strenghtning for wrist in all planes - and pt to increase sets at home over week - as well as light putty    Occupational performance deficits (Please refer to evaluation for details): ADL's;IADL's;Work;Play;Leisure   Rehab Potential Good   Current Impairments/barriers affecting progress: CT symptoms - but could be increase edema at eval    OT Frequency 1x / week   OT Duration 4  weeks   OT Treatment/Interventions Self-care/ADL training;Splinting;Patient/family education;Therapeutic exercises;Contrast Bath;Scar mobilization;Passive range of motion;Manual Therapy;Parrafin   OT Home Exercise Plan see pt instructions   Consulted and Agree with Plan of Care Patient      Patient will benefit from skilled therapeutic intervention in  order to improve the following deficits and impairments:  Decreased coordination, Decreased range of motion, Impaired flexibility, Impaired sensation, Increased edema, Decreased knowledge of precautions, Decreased strength, Impaired UE functional use, Pain  Visit Diagnosis: Pain in right wrist  Stiffness of right wrist, not elsewhere classified  Other disturbances of skin sensation  Muscle weakness (generalized)    Problem List There are no active problems to display for this patient.   Oletta CohnuPreez, Zaelyn Noack OTR/L,CLT 09/23/2016, 7:07 PM  [facility]ICINE [address] [place], [place], [address] Phone: [phone]   Fax:  [phone]  Name: Erin Bruce MRN: [id number] Date of Birth: [birth year]

## 2016-09-23 NOTE — Patient Instructions (Signed)
Taped scar with kinesiotape - 30% either side of scar   and 3 across at 100% pull to decrease adhesion and increase extention and flexion of wrist xtras provided for home use this week    Cont with PROM for wrist   hammer 16oz for sup/pro, RD and UD , wrist extention and flexion   12 reps   2-3 x day   can increase in 3 days to 2 sets if not increase pain   as if needed in 6 days 3 sets - but pain free  Light blue putty for grip , lat and 3 point grip - no pain  12 reps   1 set 2 x day  3 days 2 sets if no pain

## 2016-09-29 ENCOUNTER — Ambulatory Visit: Payer: 59 | Admitting: Occupational Therapy

## 2016-09-29 DIAGNOSIS — M6281 Muscle weakness (generalized): Secondary | ICD-10-CM

## 2016-09-29 DIAGNOSIS — M25531 Pain in right wrist: Secondary | ICD-10-CM

## 2016-09-29 DIAGNOSIS — M25631 Stiffness of right wrist, not elsewhere classified: Secondary | ICD-10-CM

## 2016-09-29 DIAGNOSIS — R208 Other disturbances of skin sensation: Secondary | ICD-10-CM | POA: Diagnosis not present

## 2016-09-29 NOTE — Therapy (Signed)
[place] [facility] [address] [place], [address] Phone: [phone]   Fax:  [phone]  Occupational Therapy Treatment  Patient Details  Name: Erin Bruce MRN: [id number] Date of Birth: 06-22-1955 Referring Provider: Martha Clan   Encounter Date: 09/29/2016      OT End of Session - 09/29/16 1800    Visit Number 5   Number of Visits 8   Date for OT Re-Evaluation 10/12/16   OT Start Time 1128   OT Stop Time 1205   OT Time Calculation (min) 37 min   Activity Tolerance Patient tolerated treatment well   Behavior During Therapy [facility] for tasks assessed/performed      Past Medical History:  Diagnosis Date  . GERD (gastroesophageal reflux disease)   . Hypertension   . Hypothyroidism   . Thyroid disease     Past Surgical History:  Procedure Laterality Date  . MENISCUS REPAIR Right   . OPEN REDUCTION INTERNAL FIXATION (ORIF) DISTAL RADIAL FRACTURE Right 08/17/2016   Procedure: OPEN REDUCTION INTERNAL FIXATION (ORIF) DISTAL RADIAL FRACTURE;  Surgeon: Juanell Fairly, MD;  Location: [facility] ORS;  Service: Orthopedics;  Laterality: Right;  . SHOULDER SURGERY Right    x4    There were no vitals filed for this visit.      Subjective Assessment - 09/29/16 1756    Subjective  I don't know what I have done but since Monday night my wrist is killing me - here on the side of my scar - tender and painfull - I don't know if I did to much , or scar masssage or tape it to tight - did replace it 2 x    Patient Stated Goals Want to get my arm better to return to work and doing the things I love - I am R hand    Currently in Pain? Yes   Pain Score 7    Pain Location Wrist   Pain Orientation Right   Pain Descriptors / Indicators Tender   Pain Type Surgical pain   Pain Onset In the past 7 days   Pain Frequency Constant            [facility] OT Assessment - 09/29/16 0001      AROM   Right Forearm Supination (P)  85  Degrees   Right Wrist Extension (P)  75 Degrees   Right Wrist Flexion (P)  67 Degrees   Right Wrist Radial Deviation (P)  27 Degrees   Right Wrist Ulnar Deviation (P)  28 Degrees      Assess AROM for wrist and thumb in all planes - on increase pain  Light resistance to wrist and thumb in all planes - no increase pain but tender on radial side of distal part of scar   pt to hold off on taping , and scar massage  Also not weight or putty   Pt to do only contrast 3 x day  AROM of wrist in all planes - pain free range  10 reps   3 x day  Splint wearing as needed to decrease pain  NO  weight , putty or scar massage until next appt             OT Treatments/Exercises (OP) - 09/29/16 0001      RUE Fluidotherapy   Number Minutes Fluidotherapy 12 Minutes   RUE Fluidotherapy Location Hand;Wrist   Comments at [facility] to decreaes pain -  but done 2 cycle of  ice inbetween to decrease pain                 OT Education - 09/29/16 1800    Education provided Yes   Education Details change HEP for pt to decrease pain    Person(s) Educated Patient   Methods Explanation;Demonstration;Tactile cues;Verbal cues   Comprehension Verbalized understanding;Verbal cues required;Returned demonstration          OT Short Term Goals - 08/31/16 1158      OT SHORT TERM GOAL #1   Title Pain on PRWHE improve with at least 15 points    Baseline at eval PRWHE  37/50 for pain   Time 2   Period Weeks   Status New     OT SHORT TERM GOAL #2   Title Edema in R wrist and scar adhesion improve for pt to show increase AROM at wrist and decrease pain at night time to less than 3/10   Baseline pain at night time 8-9/10 and edema at wrist more than 2 1/2 cm - scar swollen and some what red - to hold off on scar massage this date    Time 3   Period Weeks   Status New           OT Long Term Goals - 08/31/16 1200      OT LONG TERM GOAL #1   Title R wrist AROM improve in all planes to [facility] to  turn doorknob, use in bathing and dressing, house hold   more than 75 %    Baseline see flowsheet    Time 4   Period Weeks   Status New     OT LONG TERM GOAL #2   Title R grip strength increase to more than 50% compare to L to carry more than 5lbs and cut food, do some yard work   Baseline Grip NT - 2 1/2 wks s/p   Time 6   Period Weeks     OT LONG TERM GOAL #3   Title PRehension strenght in R hand increase to more than 50% cmpare to L to cut food , hold plate and do buttons without increase symptoms    Baseline NT - 2 1/2 wks s/p   Time 6   Period Weeks   Status New     OT LONG TERM GOAL #4   Title Function score on PRWHE improve more than 20 points to return to work   Baseline Function at eval on PRWHE 45/50   Time 6   Period Weeks   Status New               Plan - 09/29/16 1801    Clinical Impression Statement Pt arrive with pain 7/10 on radial side of distal scar - edema present and tender - pt AROM appear and tested  same as last time without increase pain - fisting no pain - light resistance to wrist in all planes and thumb - no increase pain -appear pt did maybe scar massage to hard to to long  and taping to tight - pt pain decrease in session to 1/10 - no tenderness - pt to only do AROM for wrist in all planes - after contrast - and can wear splint if needed - will reassess tomorrow    Occupational performance deficits (Please refer to evaluation for details): ADL's;IADL's;Work;Play;Leisure   Rehab Potential Good   Current Impairments/barriers affecting progress: CT symptoms - but could be increase edema at eval  OT Frequency 2x / week   OT Duration 4 weeks   OT Treatment/Interventions Self-care/ADL training;Splinting;Patient/family education;Therapeutic exercises;Contrast Bath;Scar mobilization;Passive range of motion;Manual Therapy;Parrafin   Plan assess pain improve and if can add HEP again    Clinical Decision Making Several treatment options, min-mod task  modification necessary   OT Home Exercise Plan see pt instructions   Consulted and Agree with Plan of Care Patient      Patient will benefit from skilled therapeutic intervention in order to improve the following deficits and impairments:  Decreased coordination, Decreased range of motion, Impaired flexibility, Impaired sensation, Increased edema, Decreased knowledge of precautions, Decreased strength, Impaired UE functional use, Pain  Visit Diagnosis: Pain in right wrist  Stiffness of right wrist, not elsewhere classified  Other disturbances of skin sensation  Muscle weakness (generalized)    Problem List There are no active problems to display for this patient.   Oletta CohnuPreez, Marjani Kobel OTR/L,CLT 09/29/2016, 6:04 PM  [place] [facility] [address] [place], [place], [address] Phone: [phone]   Fax:  [phone]  Name: Erin Bruce MRN: [id number] Date of Birth: 06/02/1955

## 2016-09-29 NOTE — Patient Instructions (Signed)
Pt to do only contrast 3 x day  AROM of wrist in all planes - pain free range  10 reps   3 x day  Splint wearing as needed to decrease pain  NO  weight , putty or scar massage until next appt

## 2016-09-30 ENCOUNTER — Ambulatory Visit: Payer: 59 | Admitting: Occupational Therapy

## 2016-09-30 DIAGNOSIS — M25631 Stiffness of right wrist, not elsewhere classified: Secondary | ICD-10-CM

## 2016-09-30 DIAGNOSIS — M6281 Muscle weakness (generalized): Secondary | ICD-10-CM

## 2016-09-30 DIAGNOSIS — H2513 Age-related nuclear cataract, bilateral: Secondary | ICD-10-CM | POA: Diagnosis not present

## 2016-09-30 DIAGNOSIS — M25531 Pain in right wrist: Secondary | ICD-10-CM

## 2016-09-30 DIAGNOSIS — H524 Presbyopia: Secondary | ICD-10-CM | POA: Diagnosis not present

## 2016-09-30 DIAGNOSIS — R208 Other disturbances of skin sensation: Secondary | ICD-10-CM | POA: Diagnosis not present

## 2016-09-30 NOTE — Patient Instructions (Signed)
pt to start back again gentle scar mobs    PROM for wrist extention and flexion over edge of table ; And RD , UD  Verbal cues 50% of time to go slow and not force end range  10 reps each  Add to HEP  hammer 16oz for sup/pro, RD and UD , wrist extention and flexion   12 reps   2 x day   can increase in 3 days to 2 sets if not increase pain  Teal putty done for  grip , lat and 3 point grip - no pain  12 reps   1 set 2 x day  3 days 2 sets if no pain  REINFORCE Pt TO STICK WITH HEP _ NOT OVER DO

## 2016-09-30 NOTE — Therapy (Signed)
[place] [facility] [address] [place], [place], [address] Phone: [phone]   Fax:  [phone]  Occupational Therapy Treatment  Patient Details  Name: Erin MartinezSharon G Bruce MRN: [id number] Date of Birth: 10-21-1955 Referring Provider: Martha ClanKrasinski   Encounter Date: 09/30/2016      OT End of Session - 09/30/16 1052    Number of Visits 8   Date for OT Re-Evaluation 10/12/16   OT Start Time 0950   OT Stop Time 1040   OT Time Calculation (min) 50 min   Activity Tolerance Patient tolerated treatment well   Behavior [facility] for tasks assessed/performed      Past Medical History:  Diagnosis Date  . GERD (gastroesophageal reflux disease)   . Hypertension   . Hypothyroidism   . Thyroid disease     Past Surgical History:  Procedure Laterality Date  . MENISCUS REPAIR Right   . OPEN REDUCTION INTERNAL FIXATION (ORIF) DISTAL RADIAL FRACTURE Right 08/17/2016   Procedure: OPEN REDUCTION INTERNAL FIXATION (ORIF) DISTAL RADIAL FRACTURE;  Surgeon: Juanell FairlyKrasinski, Kevin, MD;  Location: [facility] ORS;  Service: Orthopedics;  Laterality: Right;  . SHOULDER SURGERY Right    x4    There were no vitals filed for this visit.      Subjective Assessment - 09/30/16 1044    Subjective  Doing better - wear my splint last night and yesterday - and pain down to 2/10 - tried not to over do it last night    Patient Stated Goals Want to get my arm better to return to work and doing the things I love - I am R hand    Currently in Pain? Yes   Pain Score 2    Pain Location Wrist   Pain Orientation Right   Pain Descriptors / Indicators Aching   Pain Type Surgical pain   Pain Onset More than a month ago   Pain Frequency Occasional            [facility] OT Assessment - 09/30/16 0001      AROM   Right Forearm Supination 85 Degrees   Right Wrist Extension 70 Degrees   Right Wrist Flexion 65 Degrees   Right Wrist Radial Deviation 30 Degrees   Right Wrist Ulnar Deviation 32 Degrees     Strength   Right Hand Grip (lbs) 44   Right Hand Lateral Pinch 12 lbs   Right Hand 3 Point Pinch 13 lbs   Left Hand Grip (lbs) 46   Left Hand Lateral Pinch 14 lbs   Left Hand 3 Point Pinch 16 lbs                  OT Treatments/Exercises (OP) - 09/30/16 0001      RUE Fluidotherapy   Number Minutes Fluidotherapy 10 Minutes   RUE Fluidotherapy Location Hand;Wrist   Comments AT [facility] to decrease pain and increase ROM at wrist       Measurements for AROM for wrist taken - see flowsheet And grip and prehension strength   fluido therapy done -increase ROM at wrist prior to ROM and decrease pain     Scar massage gentle done - and pt to start back again gentle scar mobs    PROM for wrist extention and flexion over edge of table ; And RD , UD  Verbal cues 50% of time to go slow and not force end range  10 reps each  Add to HEP  hammer 16oz  for sup/pro, RD and UD , wrist extention and flexion   12 reps   2 x day   can increase in 3 days to 2 sets if not increase pain  Teal putty done for  grip , lat and 3 point grip - no pain  12 reps   1 set 2 x day  3 days 2 sets if no pain  REINFORCE Pt TO STICK WITH HEP _ NOT OVER DO             OT Education - 09/30/16 1052    Education provided Yes   Education Details HEP changes   Person(s) Educated Patient   Methods Explanation;Demonstration;Tactile cues;Verbal cues;Handout   Comprehension Verbal cues required;Returned demonstration;Verbalized understanding          OT Short Term Goals - 08/31/16 1158      OT SHORT TERM GOAL #1   Title Pain on PRWHE improve with at least 15 points    Baseline at eval PRWHE  37/50 for pain   Time 2   Period Weeks   Status New     OT SHORT TERM GOAL #2   Title Edema in R wrist and scar adhesion improve for pt to show increase AROM at wrist and decrease pain at night time to less than 3/10   Baseline pain at night time 8-9/10  and edema at wrist more than 2 1/2 cm - scar swollen and some what red - to hold off on scar massage this date    Time 3   Period Weeks   Status New           OT Long Term Goals - 08/31/16 1200      OT LONG TERM GOAL #1   Title R wrist AROM improve in all planes to [facility] to turn doorknob, use in bathing and dressing, house hold   more than 75 %    Baseline see flowsheet    Time 4   Period Weeks   Status New     OT LONG TERM GOAL #2   Title R grip strength increase to more than 50% compare to L to carry more than 5lbs and cut food, do some yard work   Baseline Grip NT - 2 1/2 wks s/p   Time 6   Period Weeks     OT LONG TERM GOAL #3   Title PRehension strenght in R hand increase to more than 50% cmpare to L to cut food , hold plate and do buttons without increase symptoms    Baseline NT - 2 1/2 wks s/p   Time 6   Period Weeks   Status New     OT LONG TERM GOAL #4   Title Function score on PRWHE improve more than 20 points to return to work   Baseline Function at eval on PRWHE 45/50   Time 6   Period Weeks   Status New               Plan - 09/30/16 1053    Clinical Impression Statement Pt pain so much better this date since coming in yesterday - after assessment further this date - pt did over do putty at home - reinforce for pt to slowly add PROM , 1 lbs weight for wrist and putty - but only 10 -12 reps x 2  - and can increase to 2 sets in 3 days if no increase pain - not to over do or do to  much - keep pain down     Occupational performance deficits (Please refer to evaluation for details): ADL's;IADL's;Work;Play;Leisure   Rehab Potential Good   Current Impairments/barriers affecting progress: CT symptoms - but could be increase edema at eval    OT Frequency 2x / week   OT Duration 2 weeks   OT Treatment/Interventions Self-care/ADL training;Splinting;Patient/family education;Therapeutic exercises;Contrast Bath;Scar mobilization;Passive range of motion;Manual  Therapy;Parrafin   Plan progress and if can upgrade HEP - results for xray at MD office    OT Home Exercise Plan see pt instructions   Consulted and Agree with Plan of Care Patient      Patient will benefit from skilled therapeutic intervention in order to improve the following deficits and impairments:  Decreased coordination, Decreased range of motion, Impaired flexibility, Impaired sensation, Increased edema, Decreased knowledge of precautions, Decreased strength, Impaired UE functional use, Pain  Visit Diagnosis: Pain in right wrist  Stiffness of right wrist, not elsewhere classified  Muscle weakness (generalized)    Problem List There are no active problems to display for this patient.   Oletta Cohn OTR/L,CLT 09/30/2016, 10:55 AM  [place] [facility] PHYS[facility] SPORTS MEDICINE [address], [place], [address] Phone: [phone]   Fax:  [phone]  Name: Erin Bruce MRN: [id number] Date of Birth: 06/04/1955

## 2016-10-01 DIAGNOSIS — Z760 Encounter for issue of repeat prescription: Secondary | ICD-10-CM | POA: Diagnosis not present

## 2016-10-05 DIAGNOSIS — M2241 Chondromalacia patellae, right knee: Secondary | ICD-10-CM | POA: Diagnosis not present

## 2016-10-05 DIAGNOSIS — M25561 Pain in right knee: Secondary | ICD-10-CM | POA: Diagnosis not present

## 2016-10-05 DIAGNOSIS — S52531D Colles' fracture of right radius, subsequent encounter for closed fracture with routine healing: Secondary | ICD-10-CM | POA: Diagnosis not present

## 2016-10-06 ENCOUNTER — Ambulatory Visit: Payer: 59 | Admitting: Occupational Therapy

## 2016-10-06 DIAGNOSIS — M25631 Stiffness of right wrist, not elsewhere classified: Secondary | ICD-10-CM | POA: Diagnosis not present

## 2016-10-06 DIAGNOSIS — M25531 Pain in right wrist: Secondary | ICD-10-CM | POA: Diagnosis not present

## 2016-10-06 DIAGNOSIS — M6281 Muscle weakness (generalized): Secondary | ICD-10-CM

## 2016-10-06 DIAGNOSIS — R208 Other disturbances of skin sensation: Secondary | ICD-10-CM | POA: Diagnosis not present

## 2016-10-06 NOTE — Patient Instructions (Signed)
   Upgrade to  2lbs weight for wrist  but pain free range - fitted with thumb /wrist wrap to use during  Sup/pro, wrist extention and flexion  To HOLD off on RD , UD  12 reps  2 x day  increase to green putty for gripping - but HOLD off on lat and 3 point grip  12 reps  1 set 2 x day    REINFORCE Pt TO STICK WITH HEP _ NOT OVER DO  Use ice on wrist And use thumb /wrist wrap during activities to decrease pain

## 2016-10-06 NOTE — Therapy (Signed)
[place] [facility] [address] [place], [address] Phone: [phone]   Fax:  [phone]  Occupational Therapy Treatment  Patient Details  Name: Erin Bruce MRN: [id number] Date of Birth: 1955/08/23 Referring Provider: Martha Clan   Encounter Date: 10/06/2016      OT End of Session - 10/06/16 1421    Visit Number 6   Number of Visits 8   Date for OT Re-Evaluation 10/12/16   OT Start Time 1045   OT Stop Time 1130   OT Time Calculation (min) 45 min   Activity Tolerance Patient tolerated treatment well   Behavior During Therapy [facility] for tasks assessed/performed      Past Medical History:  Diagnosis Date  . GERD (gastroesophageal reflux disease)   . Hypertension   . Hypothyroidism   . Thyroid disease     Past Surgical History:  Procedure Laterality Date  . MENISCUS REPAIR Right   . OPEN REDUCTION INTERNAL FIXATION (ORIF) DISTAL RADIAL FRACTURE Right 08/17/2016   Procedure: OPEN REDUCTION INTERNAL FIXATION (ORIF) DISTAL RADIAL FRACTURE;  Surgeon: Juanell Fairly, MD;  Location: [facility] OR[facility];  Service: Orthopedics;  Laterality: Right;  . SHOULDER SURGERY Right    x4    There were no vitals filed for this visit.      Subjective Assessment - 10/06/16 1048    Subjective  Seen Dr Kirtland Bouchard and has some tendinitis - on predisone and neurotin - if not better need to inject- using my hand in everything - no favoring - same amount as L hand    Patient Stated Goals Want to get my arm better to return to work and doing the things I love - I am R hand    Currently in Pain? Yes   Pain Score 4    Pain Location Wrist   Pain Orientation Right   Pain Descriptors / Indicators Aching   Pain Type Surgical pain            [facility] OT Assessment - 10/06/16 0001      AROM   Right Forearm Supination 85 Degrees   Right Wrist Extension 75 Degrees   Right Wrist Flexion 68 Degrees     Strength   Right Hand Grip (lbs) 48   Right Hand Lateral Pinch 12 lbs   Right Hand 3 Point Pinch 15 lbs   Left Hand Grip (lbs) 46   Left Hand Lateral Pinch 14 lbs   Left Hand 3 Point Pinch 16 lbs      Measurements for AROM for wrist taken and grip/prehension  - see flowsheet  fluido therapy done -increase flexion  at wrist prior to ROM and decrease pain     Gentle AAROM for wrist flexion done   Upgrade to  2lbs weight for wrist  but pain free range - fitted with thumb /wrist wrap to use during  Sup/pro, wrist extention and flexion  To HOLD off on RD , UD  12 reps  2 x day  increase to green putty for gripping - but HOLD off on lat and 3 point grip  12 reps  1 set 2 x day   US done see flowsheet   REINFORCE Pt TO STICK WITH HEP _ NOT OVER DO  Use ice on wrist And use thumb /wrist wrap during activities to decrease pain               OT Treatments/Exercises (OP) - 10/06/16 0001  Ultrasound   Ultrasound Location 1st dorsal compartment   Ultrasound Parameters 3.3MHZ 20%, .8 intensity for 5 min    Ultrasound Goals Pain     RUE Fluidotherapy   Number Minutes Fluidotherapy 10 Minutes   RUE Fluidotherapy Location Hand;Wrist   [facility] for writs flexion , sup                 OT Education - 10/06/16 1103    Education provided Yes   Education Details neoprene wrist wrap fitted and wearing, HEP    Person(s) Educated Patient   Methods Explanation;Demonstration;Verbal cues;Tactile cues;Handout   Comprehension Returned demonstration;Verbal cues required          OT Short Term Goals - 08/31/16 1158      OT SHORT TERM GOAL #1   Title Pain on PRWHE improve with at least 15 points    Baseline at eval PRWHE  37/50 for pain   Time 2   Period Weeks   Status New     OT SHORT TERM GOAL #2   Title Edema in R wrist and scar adhesion improve for pt to show increase AROM at wrist and decrease pain at night time to less than 3/10   Baseline pain at night time 8-9/10  and edema at wrist more than 2 1/2 cm - scar swollen and some what red - to hold off on scar massage this date    Time 3   Period Weeks   Status New           OT Long Term Goals - 08/31/16 1200      OT LONG TERM GOAL #1   Title R wrist AROM improve in all planes to [facility] to turn doorknob, use in bathing and dressing, house hold   more than 75 %    Baseline see flowsheet    Time 4   Period Weeks   Status New     OT LONG TERM GOAL #2   Title R grip strength increase to more than 50% compare to L to carry more than 5lbs and cut food, do some yard work   Baseline Grip NT - 2 1/2 wks s/p   Time 6   Period Weeks     OT LONG TERM GOAL #3   Title PRehension strenght in R hand increase to more than 50% cmpare to L to cut food , hold plate and do buttons without increase symptoms    Baseline NT - 2 1/2 wks s/p   Time 6   Period Weeks   Status New     OT LONG TERM GOAL #4   Title Function score on PRWHE improve more than 20 points to return to work   Baseline Function at eval on PRWHE 45/50   Time 6   Period Weeks   Status New               Plan - 10/06/16 1422    Clinical Impression Statement Pt report pain still about 4/10 - see Dr Kirtland BouchardK - starting today predisone and neurotin - pt's HEP upgrade but low reps and to use wrist /thumb wrap during act - and keep pain at 1/10    Occupational performance deficits (Please refer to evaluation for details): ADL's;IADL's;Work;Play;Leisure   Rehab Potential Good   Current Impairments/barriers affecting progress: CT symptoms - but could be increase edema at eval    OT Frequency 1x / week   OT Duration 2  weeks   OT Treatment/Interventions Self-care/ADL training;Splinting;Patient/family education;Therapeutic exercises;Contrast Bath;Scar mobilization;Passive range of motion;Manual Therapy;Parrafin   Plan assess pain on 1st dorsal compartment improved    Clinical Decision Making Several treatment options, min-mod task modification necessary    OT Home Exercise Plan see pt instructions   Consulted and Agree with Plan of Care Patient      Patient will benefit from skilled therapeutic intervention in order to improve the following deficits and impairments:  Decreased coordination, Decreased range of motion, Impaired flexibility, Impaired sensation, Increased edema, Decreased knowledge of precautions, Decreased strength, Impaired UE functional use, Pain  Visit Diagnosis: Pain in right wrist  Stiffness of right wrist, not elsewhere classified  Muscle weakness (generalized)    Problem List There are no active problems to display for this patient.   Oletta Cohn OTR/L,CLT 10/06/2016, 2:24 PM  [place] [facility]TS MEDICINE [address] [place], [address] Phone: [phone]   Fax:  [phone]  Name: CHAUNTAY PASZKIEWICZ MRN: [id number] Date of Birth: 03-04-1956

## 2016-10-08 ENCOUNTER — Ambulatory Visit: Payer: 59 | Admitting: Occupational Therapy

## 2016-10-11 ENCOUNTER — Ambulatory Visit: Payer: 59 | Admitting: Occupational Therapy

## 2016-10-18 ENCOUNTER — Ambulatory Visit: Payer: 59 | Admitting: Occupational Therapy

## 2016-11-26 ENCOUNTER — Other Ambulatory Visit: Payer: Self-pay | Admitting: Internal Medicine

## 2016-11-26 DIAGNOSIS — Z1231 Encounter for screening mammogram for malignant neoplasm of breast: Secondary | ICD-10-CM

## 2016-12-01 ENCOUNTER — Ambulatory Visit
Admission: RE | Admit: 2016-12-01 | Discharge: 2016-12-01 | Disposition: A | Discharge status: Home / Self Care | Payer: 59 | Source: Ambulatory Visit | Attending: Internal Medicine | Admitting: Internal Medicine

## 2016-12-01 DIAGNOSIS — Z1231 Encounter for screening mammogram for malignant neoplasm of breast: Secondary | ICD-10-CM | POA: Insufficient documentation

## 2016-12-02 DIAGNOSIS — N951 Menopausal and female climacteric states: Secondary | ICD-10-CM | POA: Diagnosis not present

## 2016-12-06 ENCOUNTER — Other Ambulatory Visit: Payer: Self-pay | Admitting: *Deleted

## 2016-12-06 ENCOUNTER — Inpatient Hospital Stay
Admission: RE | Admit: 2016-12-06 | Discharge: 2016-12-06 | Disposition: A | Discharge status: Home / Self Care | Payer: Self-pay | Source: Ambulatory Visit | Attending: *Deleted | Admitting: *Deleted

## 2016-12-06 DIAGNOSIS — Z9289 Personal history of other medical treatment: Secondary | ICD-10-CM

## 2016-12-06 DIAGNOSIS — Z72820 Sleep deprivation: Secondary | ICD-10-CM | POA: Diagnosis not present

## 2016-12-06 DIAGNOSIS — E039 Hypothyroidism, unspecified: Secondary | ICD-10-CM | POA: Diagnosis not present

## 2016-12-06 DIAGNOSIS — R5383 Other fatigue: Secondary | ICD-10-CM | POA: Diagnosis not present

## 2016-12-06 DIAGNOSIS — R232 Flushing: Secondary | ICD-10-CM | POA: Diagnosis not present

## 2017-01-03 ENCOUNTER — Ambulatory Visit: Payer: Self-pay | Admitting: Physician Assistant

## 2017-01-03 ENCOUNTER — Encounter: Payer: Self-pay | Admitting: Physician Assistant

## 2017-01-03 VITALS — BP 152/100 | HR 58 | Temp 97.7°F

## 2017-01-03 DIAGNOSIS — J0100 Acute maxillary sinusitis, unspecified: Secondary | ICD-10-CM

## 2017-01-03 MED ORDER — FLUCONAZOLE 150 MG PO TABS: ORAL_TABLET | ORAL | 0 refills | Status: DC

## 2017-01-03 MED ORDER — AMOXICILLIN 875 MG PO TABS: 875.0000 mg | ORAL_TABLET | Freq: Two times a day (BID) | ORAL | 0 refills | Status: DC

## 2017-01-03 MED ORDER — BENZONATATE 200 MG PO CAPS: 200.0000 mg | ORAL_CAPSULE | Freq: Three times a day (TID) | ORAL | 0 refills | Status: DC | PRN

## 2017-01-03 NOTE — Progress Notes (Signed)
S: C/o runny nose and congestion for 3 days, no fever, chills, cp/sob, v/d; mucus is green and thick, cough is sporadic, c/o of facial and dental pain.   Using otc meds: sudafed  O: PE: vitals w elevated bp, perrl eomi, normocephalic, tms dull, nasal mucosa red and swollen, throat injected, neck supple no lymph, lungs c t a, cv rrr, neuro intact  A:  Acute sinusitis   P: drink fluids, continue regular meds , use otc meds of choice, return if not improving in 5 days, return earlier if worsening , amoxil, diflucan, tessalon perls for cough if needed

## 2017-01-07 DIAGNOSIS — E039 Hypothyroidism, unspecified: Secondary | ICD-10-CM | POA: Diagnosis not present

## 2017-01-07 DIAGNOSIS — R232 Flushing: Secondary | ICD-10-CM | POA: Diagnosis not present

## 2017-01-18 DIAGNOSIS — E039 Hypothyroidism, unspecified: Secondary | ICD-10-CM | POA: Diagnosis not present

## 2017-01-18 DIAGNOSIS — R5383 Other fatigue: Secondary | ICD-10-CM | POA: Diagnosis not present

## 2017-01-18 DIAGNOSIS — Z72820 Sleep deprivation: Secondary | ICD-10-CM | POA: Diagnosis not present

## 2017-01-18 DIAGNOSIS — M2550 Pain in unspecified joint: Secondary | ICD-10-CM | POA: Diagnosis not present

## 2017-01-27 IMAGING — CT CT OF THE RIGHT HIP WITHOUT CONTRAST
2 of 4 series · 13 of 46 positions shown, 15 images · non-contrast
Comparison: MRI of the hip dated 02/25/2011
COMPARISON: None.

CLINICAL DATA: Right leg and right thigh swelling and pain
secondary to a fall 5 days ago.

EXAM:
CT OF THE RIGHT HIP WITHOUT CONTRAST
TECHNIQUE: Multidetector CT imaging of the right hip was performed according to
the standard protocol. Multiplanar CT image reconstructions were
also generated.
TECHNIQUE: Multidetector CT imaging of the right thigh was performed according
to the standard protocol.

[Series 6: soft tissue axials · axial · 0.41mm/px · z∈[-244,+216]mm · 10 of 266 slices shown, 12 images]
[im 18/266  soft-tissue]
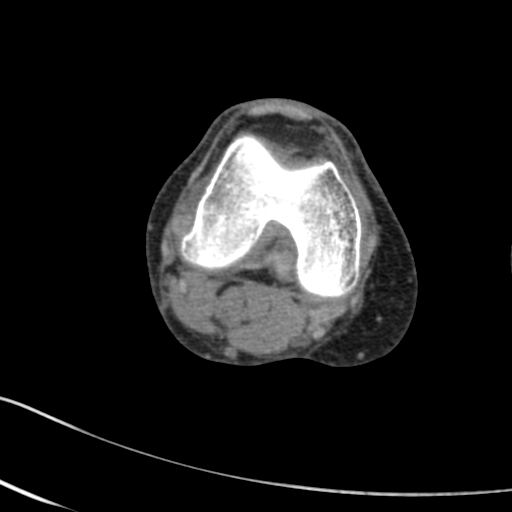
[im 18/266  bone]
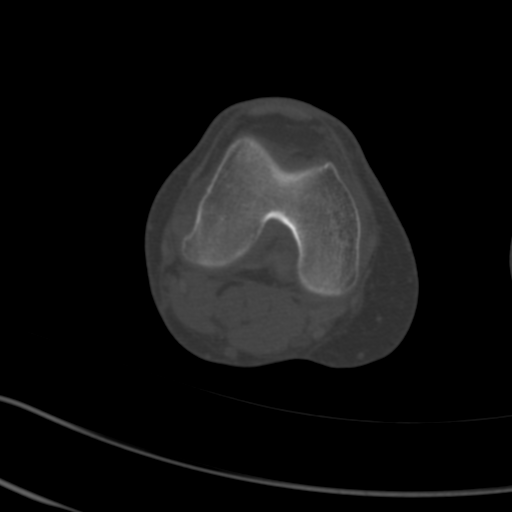
[im 43/266  soft-tissue]
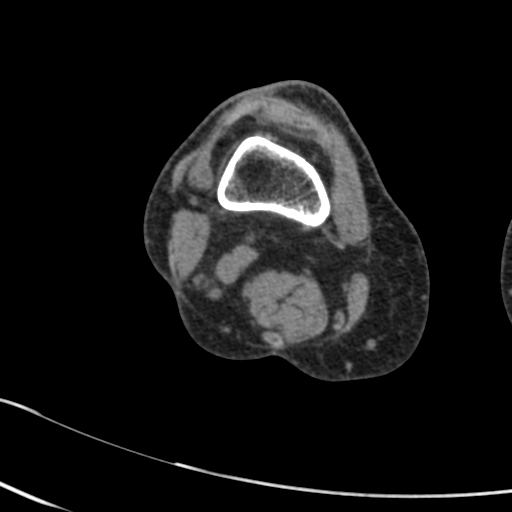
[im 69/266  soft-tissue]
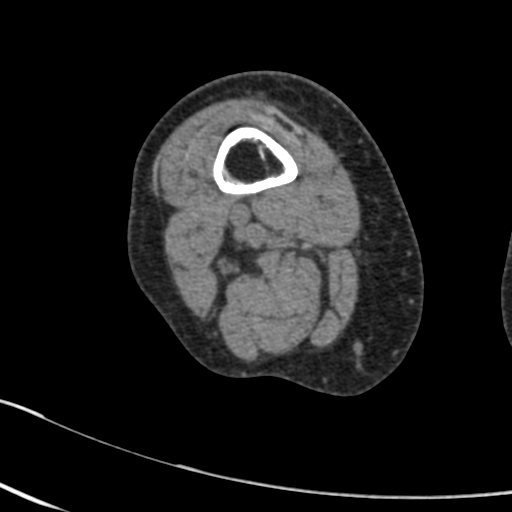
[im 95/266  soft-tissue]
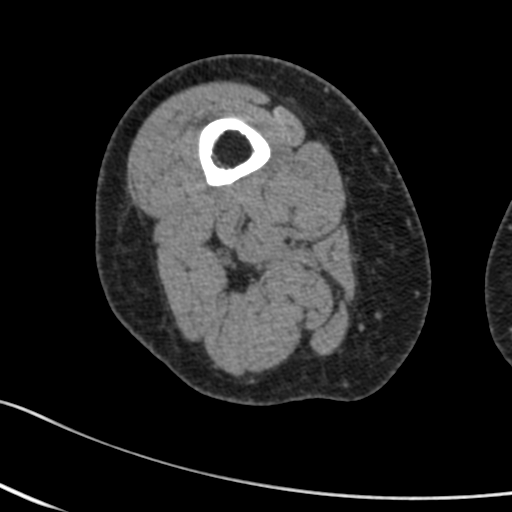
[im 120/266  soft-tissue]
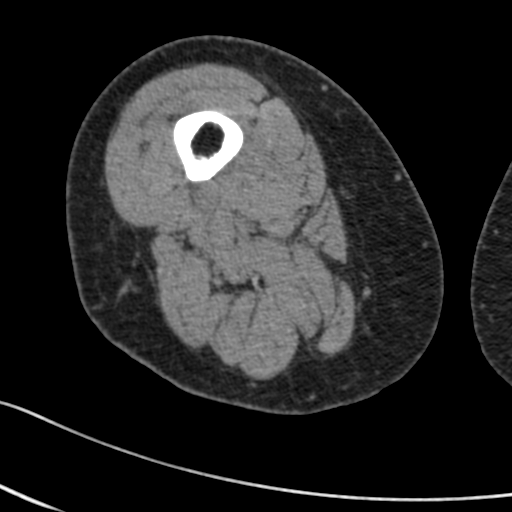
[im 146/266  soft-tissue]
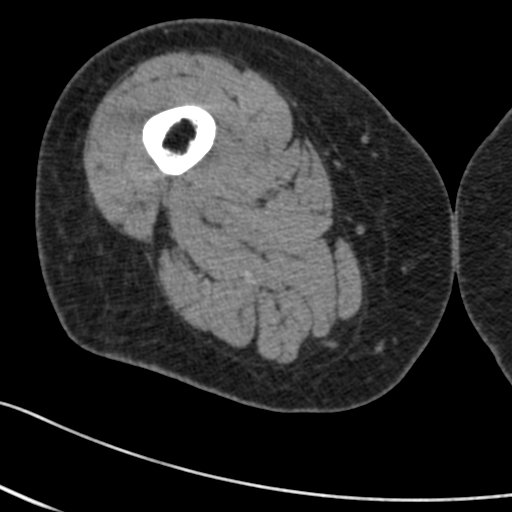
[im 171/266  soft-tissue]
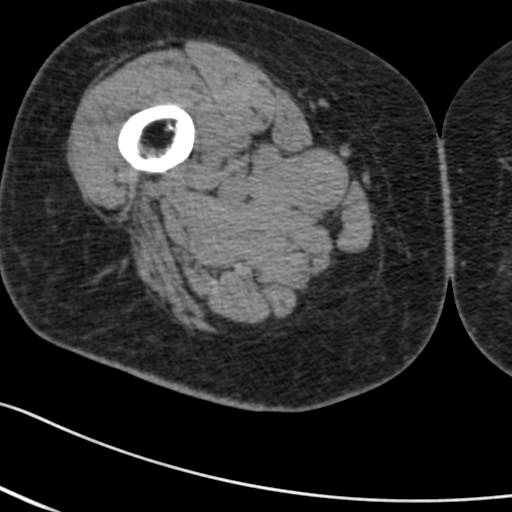
[im 197/266  soft-tissue]
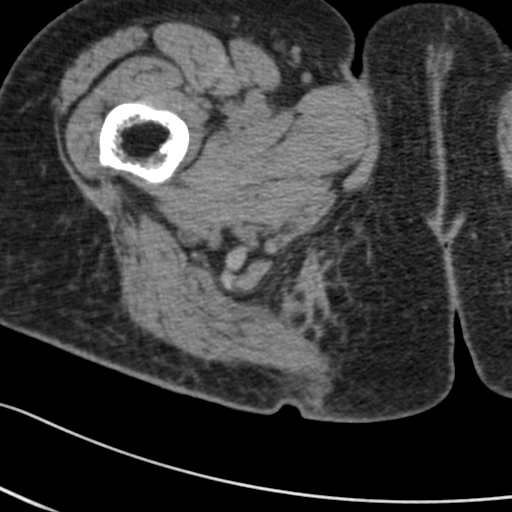
[im 223/266  soft-tissue]
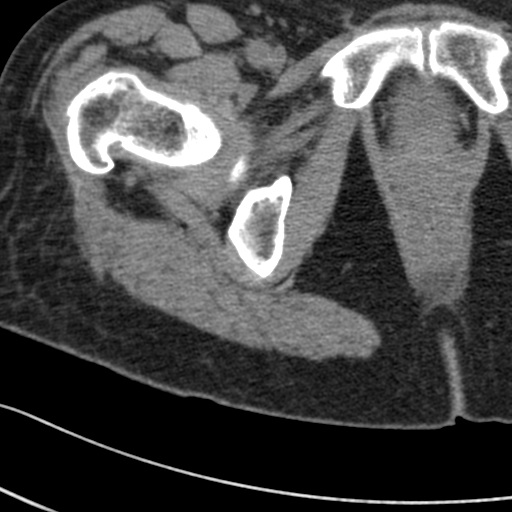
[im 223/266  bone]
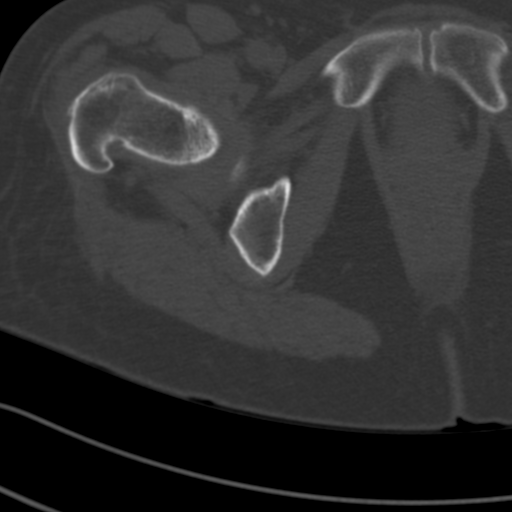
[im 248/266  soft-tissue]
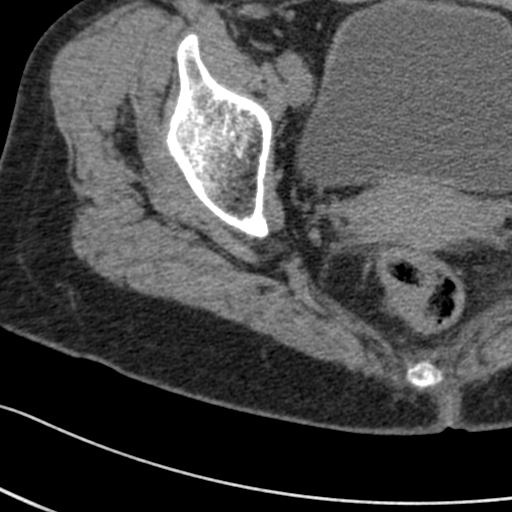

[Series 8: coronal soft tissue · coronal · 0.45mm/px · 3 of 68 slices shown]
[im 17/68  soft-tissue]
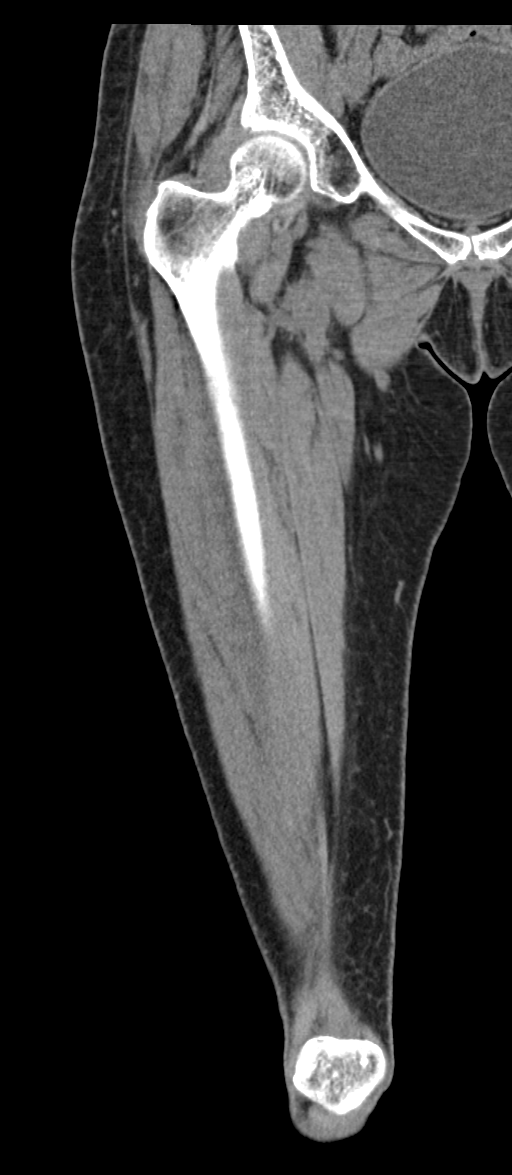
[im 34/68  soft-tissue]
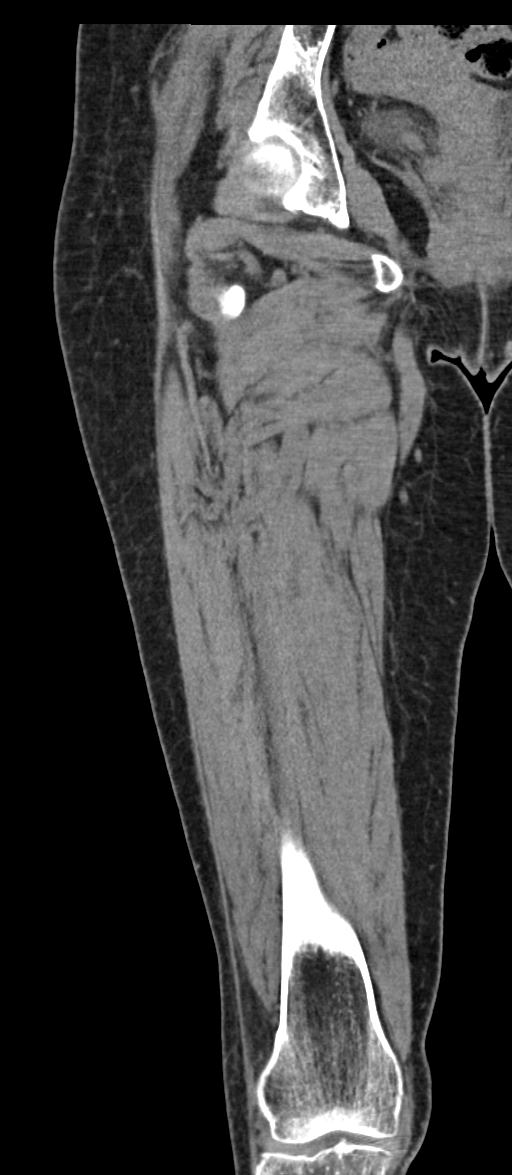
[im 51/68  soft-tissue]
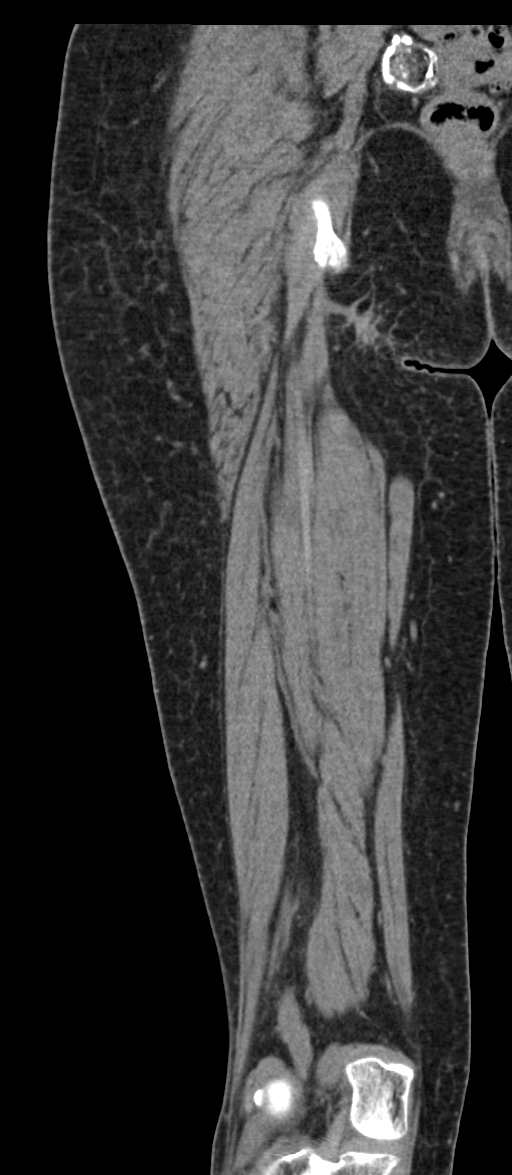

[13 of 46 positions shown; findings below may reference images not displayed]

FINDINGS: There is no fracture or dislocation or joint effusion. Subcutaneous
soft tissues appear normal. Muscle structures around the right hip
are normal. There is a 3.4 x 2.6 x 2.8 cm inhomogeneous partially
calcified mass in the right side of the pelvis just posterior to the
uterus which probably represents a dermoid on the right ovary.

There is a small sclerotic bone island in the anterior medial aspect
of the proximal right femoral neck, unchanged. No other abnormality.
IMPRESSION: Negative CT scan of the right hip. Probable calcified dermoid in the
right side of the pelvis. Compared with the prior hip MRI, I think
that this lesion is unchanged. Dated appear to be stool within the
colon on the prior exam but now comparing it with this CT scan, I
believe it represents a dermoid, unchanged.

EXAM:
CT OF THE RIGHT THIGH WITHOUT CONTRAST
FINDINGS: The right femur appears normal. No fracture or other significant
abnormality. The muscle structures of the thigh appear normal. There
is no soft tissue hematoma or contusion. There are no hip or knee
effusions. There is slight narrowing of the medial compartment of
the knee.
IMPRESSION: No acute abnormalities of the right thigh. Slight degenerative
arthritic changes of medial compartment of the right knee.

## 2017-02-02 DIAGNOSIS — E079 Disorder of thyroid, unspecified: Secondary | ICD-10-CM | POA: Diagnosis not present

## 2017-02-02 DIAGNOSIS — G629 Polyneuropathy, unspecified: Secondary | ICD-10-CM | POA: Diagnosis not present

## 2017-02-02 DIAGNOSIS — I10 Essential (primary) hypertension: Secondary | ICD-10-CM | POA: Diagnosis not present

## 2017-02-02 DIAGNOSIS — M810 Age-related osteoporosis without current pathological fracture: Secondary | ICD-10-CM | POA: Diagnosis not present

## 2017-02-23 DIAGNOSIS — M2550 Pain in unspecified joint: Secondary | ICD-10-CM | POA: Diagnosis not present

## 2017-02-23 DIAGNOSIS — R5383 Other fatigue: Secondary | ICD-10-CM | POA: Diagnosis not present

## 2017-02-23 DIAGNOSIS — Z72820 Sleep deprivation: Secondary | ICD-10-CM | POA: Diagnosis not present

## 2017-03-11 DIAGNOSIS — E039 Hypothyroidism, unspecified: Secondary | ICD-10-CM | POA: Diagnosis not present

## 2017-03-11 DIAGNOSIS — M2550 Pain in unspecified joint: Secondary | ICD-10-CM | POA: Diagnosis not present

## 2017-03-11 DIAGNOSIS — R232 Flushing: Secondary | ICD-10-CM | POA: Diagnosis not present

## 2017-03-11 DIAGNOSIS — R5383 Other fatigue: Secondary | ICD-10-CM | POA: Diagnosis not present

## 2017-03-11 DIAGNOSIS — Z72820 Sleep deprivation: Secondary | ICD-10-CM | POA: Diagnosis not present

## 2017-07-07 DIAGNOSIS — Z72820 Sleep deprivation: Secondary | ICD-10-CM | POA: Diagnosis not present

## 2017-07-07 DIAGNOSIS — R5383 Other fatigue: Secondary | ICD-10-CM | POA: Diagnosis not present

## 2017-07-07 DIAGNOSIS — M2550 Pain in unspecified joint: Secondary | ICD-10-CM | POA: Diagnosis not present

## 2017-07-07 DIAGNOSIS — R232 Flushing: Secondary | ICD-10-CM | POA: Diagnosis not present

## 2017-07-13 DIAGNOSIS — R232 Flushing: Secondary | ICD-10-CM | POA: Diagnosis not present

## 2017-07-13 DIAGNOSIS — E039 Hypothyroidism, unspecified: Secondary | ICD-10-CM | POA: Diagnosis not present

## 2017-07-13 DIAGNOSIS — Z72820 Sleep deprivation: Secondary | ICD-10-CM | POA: Diagnosis not present

## 2017-07-13 DIAGNOSIS — R5383 Other fatigue: Secondary | ICD-10-CM | POA: Diagnosis not present

## 2017-08-17 DIAGNOSIS — E079 Disorder of thyroid, unspecified: Secondary | ICD-10-CM | POA: Diagnosis not present

## 2017-08-17 DIAGNOSIS — G629 Polyneuropathy, unspecified: Secondary | ICD-10-CM | POA: Diagnosis not present

## 2017-08-17 DIAGNOSIS — Z79899 Other long term (current) drug therapy: Secondary | ICD-10-CM | POA: Diagnosis not present

## 2017-08-17 DIAGNOSIS — M810 Age-related osteoporosis without current pathological fracture: Secondary | ICD-10-CM | POA: Diagnosis not present

## 2017-08-17 DIAGNOSIS — I10 Essential (primary) hypertension: Secondary | ICD-10-CM | POA: Diagnosis not present

## 2017-08-24 DIAGNOSIS — I10 Essential (primary) hypertension: Secondary | ICD-10-CM | POA: Diagnosis not present

## 2017-08-24 DIAGNOSIS — Z124 Encounter for screening for malignant neoplasm of cervix: Secondary | ICD-10-CM | POA: Diagnosis not present

## 2017-08-24 DIAGNOSIS — M810 Age-related osteoporosis without current pathological fracture: Secondary | ICD-10-CM | POA: Diagnosis not present

## 2017-08-24 DIAGNOSIS — G629 Polyneuropathy, unspecified: Secondary | ICD-10-CM | POA: Diagnosis not present

## 2017-08-24 DIAGNOSIS — E079 Disorder of thyroid, unspecified: Secondary | ICD-10-CM | POA: Diagnosis not present

## 2017-09-14 DIAGNOSIS — E079 Disorder of thyroid, unspecified: Secondary | ICD-10-CM | POA: Diagnosis not present

## 2017-09-22 DIAGNOSIS — M810 Age-related osteoporosis without current pathological fracture: Secondary | ICD-10-CM | POA: Diagnosis not present

## 2017-09-22 DIAGNOSIS — E079 Disorder of thyroid, unspecified: Secondary | ICD-10-CM | POA: Diagnosis not present

## 2017-09-22 DIAGNOSIS — G629 Polyneuropathy, unspecified: Secondary | ICD-10-CM | POA: Diagnosis not present

## 2017-09-22 DIAGNOSIS — I10 Essential (primary) hypertension: Secondary | ICD-10-CM | POA: Diagnosis not present

## 2017-10-04 DIAGNOSIS — J309 Allergic rhinitis, unspecified: Secondary | ICD-10-CM | POA: Diagnosis not present

## 2017-10-04 DIAGNOSIS — K219 Gastro-esophageal reflux disease without esophagitis: Secondary | ICD-10-CM | POA: Diagnosis not present

## 2017-10-04 DIAGNOSIS — R070 Pain in throat: Secondary | ICD-10-CM | POA: Diagnosis not present

## 2017-11-16 DIAGNOSIS — R5383 Other fatigue: Secondary | ICD-10-CM | POA: Diagnosis not present

## 2017-11-16 DIAGNOSIS — Z72820 Sleep deprivation: Secondary | ICD-10-CM | POA: Diagnosis not present

## 2017-11-16 DIAGNOSIS — R232 Flushing: Secondary | ICD-10-CM | POA: Diagnosis not present

## 2017-11-24 DIAGNOSIS — E039 Hypothyroidism, unspecified: Secondary | ICD-10-CM | POA: Diagnosis not present

## 2017-11-24 DIAGNOSIS — R232 Flushing: Secondary | ICD-10-CM | POA: Diagnosis not present

## 2017-11-24 DIAGNOSIS — R5383 Other fatigue: Secondary | ICD-10-CM | POA: Diagnosis not present

## 2017-11-24 DIAGNOSIS — N951 Menopausal and female climacteric states: Secondary | ICD-10-CM | POA: Diagnosis not present

## 2018-03-08 DIAGNOSIS — B349 Viral infection, unspecified: Secondary | ICD-10-CM | POA: Diagnosis not present

## 2018-03-14 ENCOUNTER — Ambulatory Visit (INDEPENDENT_AMBULATORY_CARE_PROVIDER_SITE_OTHER): Payer: 59 | Admitting: General Surgery

## 2018-03-14 ENCOUNTER — Encounter: Payer: Self-pay | Admitting: General Surgery

## 2018-03-14 ENCOUNTER — Other Ambulatory Visit: Payer: Self-pay

## 2018-03-14 VITALS — BP 145/85 | HR 89 | Temp 98.1°F | Ht 66.0 in | Wt 146.4 lb

## 2018-03-14 DIAGNOSIS — G4700 Insomnia, unspecified: Secondary | ICD-10-CM | POA: Insufficient documentation

## 2018-03-14 DIAGNOSIS — R2232 Localized swelling, mass and lump, left upper limb: Secondary | ICD-10-CM

## 2018-03-14 DIAGNOSIS — I10 Essential (primary) hypertension: Secondary | ICD-10-CM | POA: Insufficient documentation

## 2018-03-14 DIAGNOSIS — L430 Hypertrophic lichen planus: Secondary | ICD-10-CM | POA: Diagnosis not present

## 2018-03-14 DIAGNOSIS — E079 Disorder of thyroid, unspecified: Secondary | ICD-10-CM | POA: Insufficient documentation

## 2018-03-14 NOTE — Progress Notes (Signed)
Patient ID: Erin MartinezSharon G Bruce, female   DOB: 19-Sep-1955, 62 y.o.   MRN: [id number]  Chief Complaint  Patient presents with  . New Patient (Initial Visit)    small area on left forearm    HPI Erin MartinezSharon G Bruce is a 62 y.o. female.  Here today for evaluation of small area on left forearm.Patient complains of roughness.   Past Medical History:  Diagnosis Date  . GERD (gastroesophageal reflux disease)   . Hypertension   . Hypothyroidism   . Thyroid disease     Past Surgical History:  Procedure Laterality Date  . BREAST BIOPSY Left    bx/clip- neg  . MENISCUS REPAIR Right   . OPEN REDUCTION INTERNAL FIXATION (ORIF) DISTAL RADIAL FRACTURE Right 08/17/2016   Procedure: OPEN REDUCTION INTERNAL FIXATION (ORIF) DISTAL RADIAL FRACTURE;  Surgeon: Juanell FairlyKrasinski, Kevin, MD;  Location: [facility] ORS;  Service: Orthopedics;  Laterality: Right;  . SHOULDER SURGERY Right    x4    Family History  Problem Relation Age of Onset  . Breast cancer Neg Hx     Social History Social History   Tobacco Use  . Smoking status: Never Smoker  . Smokeless tobacco: Never Used  Substance Use Topics  . Alcohol use: Yes    Alcohol/week: 0.0 standard drinks    Comment: very rare  . Drug use: No    Allergies  Allergen Reactions  . Acyclovir And Related   . Alendronate     Other reaction(s): Other (See Comments) Dyspepsia/esophagitis  . Buspirone     Other reaction(s): Other (See Comments) confusion  . Diltiazem     Other reaction(s): Other (See Comments) Leg edema  . Losartan     tachycardia  . Tramadol Palpitations    Current Outpatient Medications  Medication Sig Dispense Refill  . amLODipine (NORVASC) 10 MG tablet Take 10 mg by mouth daily.    Marland Kitchen. amoxicillin (AMOXIL) 875 MG tablet Take 1 tablet (875 mg total) by mouth 2 (two) times daily. 20 tablet 0  . azithromycin (ZITHROMAX) 250 MG tablet   0  . celecoxib (CELEBREX) 200 MG capsule Take by mouth.    . Eszopiclone 3 MG TABS TAKE 1 TABLET BY MOUTH  ONCE DAILY AT BEDTIME AS NEEDED    . famotidine (PEPCID) 10 MG tablet Take by mouth.    . fluticasone (FLONASE) 50 MCG/ACT nasal spray Place into the nose.    . gabapentin (NEURONTIN) 300 MG capsule Take by mouth.    . levothyroxine (SYNTHROID, LEVOTHROID) 150 MCG tablet Take 150 mcg by mouth daily before breakfast.    . Multiple Vitamin (MULTIVITAMIN) tablet Take 1 tablet by mouth daily.    Marland Kitchen. omeprazole (PRILOSEC) 40 MG capsule Take 40 mg by mouth every morning.   1  . progesterone (ENDOMETRIN) 100 MG vaginal insert TAKE 1 CAPSULE BY MOUTH DAILY AT BEDTIME.  0  . tiZANidine (ZANAFLEX) 4 MG tablet Take 4 mg by mouth every 6 (six) hours as needed.     . zolpidem (AMBIEN CR) 6.25 MG CR tablet Take 6.25 mg by mouth at bedtime as needed for sleep.     No current facility-administered medications for this visit.     Review of Systems Review of Systems  Constitutional: Negative.   Respiratory: Negative.   Skin: Negative.     Blood pressure (!) 145/85, pulse 89, temperature 98.1 F (36.7 C), temperature source Temporal, height 5\' 6"  (1.676 m), weight 146 lb 6.4 oz (66.4 kg).  Physical Exam Physical Exam  Constitutional: She appears well-developed and well-nourished.  Pulmonary/Chest: Effort normal.  Musculoskeletal:       Arms: Skin: Skin is warm and dry.  Psychiatric: Her behavior is normal.    Data Reviewed Plans for excision were reviewed and acceptable to the patient.  The area was cleansed with alcohol followed by 5 cc of 0.5% Xylocaine with 0.25% Marcaine with 1 to 200,000 units of epinephrine.  ChloraPrep was applied to the skin.  The lesion was excised in elliptical longitudinal incision.  A suture was placed at the superior border for orientation.  Underlying vein was not disturbed.  A small venous branch was controlled with a 4-0 Vicryl suture.  The skin incision was closed with interrupted 5-0 Prolene simple sutures.  Telfa and Tegaderm dressing applied.  Ice pack  provided.  Assessment    Irregular skin lesion, excised.    Plan    Postoperative wound care reviewed.  Tegaderm dressing can be removed in 2-3 days if desired.  Her sister, and RN, will remove her sutures in 1 week and apply Steri-Strips.  She will be contacted when pathology is available.      HPI, Physical Exam, Assessment and Plan have been scribed under the direction and in the presence of Earline Mayotte, MD. Arn Medal, CMA  I have completed the exam and reviewed the above documentation for accuracy and completeness.  I agree with the above.  Museum/gallery conservator has been used and any errors in dictation or transcription are unintentional.  Donnalee Curry, M.D., F.A.C.S. PE: [name] 03/14/2018, 12:09 PM

## 2018-03-14 NOTE — Patient Instructions (Addendum)
The patient is aware to call back for any questions or new concerns.  May shower May remove dressing in 2-3 days Suture removal 1 week May use an Ice pack as needed for comfort

## 2018-03-21 ENCOUNTER — Telehealth: Payer: Self-pay | Admitting: General Surgery

## 2018-03-21 NOTE — Telephone Encounter (Signed)
Patient has called and would like to know her pathology results. Patient is a Charity fundraiserN at [employer]. She had an excision of lesion of left forearm by Dr Lemar LivingsByrnett on 03/14/18. Pathology is in epic.  Patient stated that if she does not answer, a detailed message can be left on her VM. Phone number [phone].

## 2018-03-22 NOTE — Telephone Encounter (Signed)
Message left for patient informing her that the pathology was negative and making sure that her sister took out her sutures.

## 2018-03-24 DIAGNOSIS — R5383 Other fatigue: Secondary | ICD-10-CM | POA: Diagnosis not present

## 2018-03-24 DIAGNOSIS — M2550 Pain in unspecified joint: Secondary | ICD-10-CM | POA: Diagnosis not present

## 2018-03-24 DIAGNOSIS — N951 Menopausal and female climacteric states: Secondary | ICD-10-CM | POA: Diagnosis not present

## 2018-03-24 DIAGNOSIS — R232 Flushing: Secondary | ICD-10-CM | POA: Diagnosis not present

## 2018-03-24 DIAGNOSIS — E039 Hypothyroidism, unspecified: Secondary | ICD-10-CM | POA: Diagnosis not present

## 2018-03-29 ENCOUNTER — Other Ambulatory Visit: Payer: Self-pay | Admitting: Internal Medicine

## 2018-03-29 DIAGNOSIS — E039 Hypothyroidism, unspecified: Secondary | ICD-10-CM | POA: Diagnosis not present

## 2018-03-29 DIAGNOSIS — R5383 Other fatigue: Secondary | ICD-10-CM | POA: Diagnosis not present

## 2018-03-29 DIAGNOSIS — Z1231 Encounter for screening mammogram for malignant neoplasm of breast: Secondary | ICD-10-CM

## 2018-03-29 DIAGNOSIS — R232 Flushing: Secondary | ICD-10-CM | POA: Diagnosis not present

## 2018-03-29 DIAGNOSIS — M2550 Pain in unspecified joint: Secondary | ICD-10-CM | POA: Diagnosis not present

## 2018-03-29 DIAGNOSIS — N951 Menopausal and female climacteric states: Secondary | ICD-10-CM | POA: Diagnosis not present

## 2018-03-29 DIAGNOSIS — Z72820 Sleep deprivation: Secondary | ICD-10-CM | POA: Diagnosis not present

## 2018-04-06 ENCOUNTER — Ambulatory Visit
Admission: RE | Admit: 2018-04-06 | Discharge: 2018-04-06 | Disposition: A | Discharge status: Home / Self Care | Payer: 59 | Source: Ambulatory Visit | Attending: Internal Medicine | Admitting: Internal Medicine

## 2018-04-06 DIAGNOSIS — G629 Polyneuropathy, unspecified: Secondary | ICD-10-CM | POA: Diagnosis not present

## 2018-04-06 DIAGNOSIS — D751 Secondary polycythemia: Secondary | ICD-10-CM | POA: Diagnosis not present

## 2018-04-06 DIAGNOSIS — I10 Essential (primary) hypertension: Secondary | ICD-10-CM | POA: Diagnosis not present

## 2018-04-06 DIAGNOSIS — Z1231 Encounter for screening mammogram for malignant neoplasm of breast: Secondary | ICD-10-CM | POA: Diagnosis not present

## 2018-04-06 DIAGNOSIS — E079 Disorder of thyroid, unspecified: Secondary | ICD-10-CM | POA: Diagnosis not present

## 2018-04-06 DIAGNOSIS — M810 Age-related osteoporosis without current pathological fracture: Secondary | ICD-10-CM | POA: Diagnosis not present

## 2018-07-27 MED FILL — ZOLPIDEM TART ER 6.25 MG TA: 6.25 | 30 days supply | Qty: 30 | Fill #0

## 2018-07-27 MED FILL — ALPRAZolam 0.25 MG TABS: 0.25 | 30 days supply | Qty: 90 | Fill #0

## 2018-07-27 MED FILL — LIDOCAINE PATCH 5%: 5 | 30 days supply | Qty: 60 | Fill #0

## 2018-07-27 MED FILL — ENALAPRIL MALEATE 10 MG TAB: 10 | 90 days supply | Qty: 90 | Fill #0

## 2018-08-19 MED FILL — tiZANidine HCL 4 MG TABS: 4 | 20 days supply | Qty: 60 | Fill #0

## 2018-08-19 MED FILL — LEVOTHYROXINE 137 MCG TABLE: 137 | 30 days supply | Qty: 30 | Fill #0

## 2018-08-19 MED FILL — GABAPENTIN 300 MG CAPSULE: 300 | 30 days supply | Qty: 60 | Fill #0

## 2018-08-19 MED FILL — ESZOPICLONE 3 MG TABS: 3 | 30 days supply | Qty: 30 | Fill #0

## 2018-09-07 MED FILL — AMLODIPINE BESYLATE 10 MG T: 10 | 30 days supply | Qty: 30 | Fill #0

## 2018-09-13 MED FILL — tiZANidine HCL 4 MG TABS: 4 | 20 days supply | Qty: 60 | Fill #1

## 2018-09-13 MED FILL — GABAPENTIN 300 MG CAPSULE: 300 | 30 days supply | Qty: 60 | Fill #1

## 2018-09-13 MED FILL — ALPRAZolam 0.25 MG TABS: 0.25 | 30 days supply | Qty: 90 | Fill #1

## 2018-09-13 MED FILL — ZOLPIDEM TART ER 6.25 MG TA: 6.25 | 30 days supply | Qty: 30 | Fill #0

## 2018-09-14 MED FILL — ESZOPICLONE 3 MG TABS: 3 | 30 days supply | Qty: 30 | Fill #1

## 2018-09-14 MED FILL — LIDOCAINE PATCH 5%: 5 | 30 days supply | Qty: 60 | Fill #0

## 2018-10-09 MED FILL — OMEPRAZOLE 40 MG CPDR: 40 | 90 days supply | Qty: 90 | Fill #0

## 2018-10-10 MED FILL — AMLODIPINE BESYLATE 10 MG T: 10 | 90 days supply | Qty: 90 | Fill #1

## 2018-10-10 MED FILL — tiZANidine HCL 4 MG TABS: 4 | 20 days supply | Qty: 60 | Fill #0

## 2018-10-10 MED FILL — ALPRAZolam 0.25 MG TABS: 0.25 | 30 days supply | Qty: 90 | Fill #0

## 2018-10-10 MED FILL — GABAPENTIN 300 MG CAPSULE: 300 | 30 days supply | Qty: 60 | Fill #2

## 2018-10-10 MED FILL — LIDOCAINE PATCH 5%: 5 | 30 days supply | Qty: 60 | Fill #1

## 2018-10-11 MED FILL — ZOLPIDEM TART ER 6.25 MG TA: 6.25 | 30 days supply | Qty: 30 | Fill #1

## 2018-10-12 MED FILL — ESZOPICLONE 3 MG TABS: 3 | 30 days supply | Qty: 30 | Fill #2

## 2018-10-17 MED FILL — ENALAPRIL MALEATE 10 MG TAB: 10 | 90 days supply | Qty: 90 | Fill #1

## 2018-10-24 MED FILL — LEVOTHYROXINE 137 MCG TABLE: 137 | 90 days supply | Qty: 90 | Fill #0

## 2018-11-08 MED FILL — tiZANidine HCL 4 MG TABS: 4 | 20 days supply | Qty: 60 | Fill #1

## 2018-11-08 MED FILL — GABAPENTIN 300 MG CAPSULE: 300 | 30 days supply | Qty: 60 | Fill #3

## 2018-11-08 MED FILL — ZOLPIDEM TART ER 6.25 MG TA: 6.25 | 30 days supply | Qty: 30 | Fill #1

## 2018-11-08 MED FILL — ESZOPICLONE 3 MG TABS: 3 | 30 days supply | Qty: 30 | Fill #3

## 2018-11-09 MED FILL — LIDOCAINE PATCH 5%: 5 | 30 days supply | Qty: 60 | Fill #0

## 2018-12-04 DIAGNOSIS — E079 Disorder of thyroid, unspecified: Secondary | ICD-10-CM | POA: Diagnosis not present

## 2018-12-04 DIAGNOSIS — I10 Essential (primary) hypertension: Secondary | ICD-10-CM | POA: Diagnosis not present

## 2018-12-04 MED FILL — ZOLPIDEM TART ER 6.25 MG TA: 6.25 | 30 days supply | Qty: 30 | Fill #2

## 2018-12-04 MED FILL — ESZOPICLONE 3 MG TABS: 3 | 30 days supply | Qty: 30 | Fill #4

## 2018-12-04 MED FILL — LIDOCAINE PATCH 5%: 5 | 30 days supply | Qty: 60 | Fill #1

## 2018-12-04 MED FILL — tiZANidine HCL 4 MG TABS: 4 | 60 days supply | Qty: 180 | Fill #0

## 2018-12-06 DIAGNOSIS — E079 Disorder of thyroid, unspecified: Secondary | ICD-10-CM | POA: Diagnosis not present

## 2018-12-06 DIAGNOSIS — G629 Polyneuropathy, unspecified: Secondary | ICD-10-CM | POA: Diagnosis not present

## 2018-12-06 DIAGNOSIS — Z0000 Encounter for general adult medical examination without abnormal findings: Secondary | ICD-10-CM | POA: Diagnosis not present

## 2018-12-06 DIAGNOSIS — I10 Essential (primary) hypertension: Secondary | ICD-10-CM | POA: Diagnosis not present

## 2018-12-06 MED FILL — LEVOTHYROXINE 125 MCG TABLE: 125 | 30 days supply | Qty: 30 | Fill #0

## 2018-12-13 DIAGNOSIS — R748 Abnormal levels of other serum enzymes: Secondary | ICD-10-CM | POA: Diagnosis not present

## 2018-12-13 DIAGNOSIS — E079 Disorder of thyroid, unspecified: Secondary | ICD-10-CM | POA: Diagnosis not present

## 2018-12-15 MED FILL — LEVOTHYROXINE 125 MCG TABLE: 125 | 90 days supply | Qty: 90 | Fill #0

## 2018-12-31 MED FILL — OMEPRAZOLE 40 MG CPDR: 40 | 90 days supply | Qty: 90 | Fill #1

## 2018-12-31 MED FILL — ZOLPIDEM TART ER 6.25 MG TA: 6.25 | 30 days supply | Qty: 30 | Fill #3

## 2019-01-01 MED FILL — ESZOPICLONE 3 MG TABS: 3 | 30 days supply | Qty: 30 | Fill #0

## 2019-01-01 MED FILL — LIDOCAINE PATCH 5%: 5 | 30 days supply | Qty: 60 | Fill #0

## 2019-01-01 MED FILL — AMLODIPINE BESYLATE 10 MG T: 10 | 90 days supply | Qty: 90 | Fill #2

## 2019-01-03 MED FILL — ENALAPRIL MALEATE 10 MG TAB: 10 | 90 days supply | Qty: 45 | Fill #0

## 2019-02-04 MED FILL — ESZOPICLONE 3 MG TABS: 3 | 30 days supply | Qty: 30 | Fill #1

## 2019-02-04 MED FILL — ZOLPIDEM TART ER 6.25 MG TA: 6.25 | 30 days supply | Qty: 30 | Fill #4

## 2019-02-05 MED FILL — tiZANidine HCL 4 MG TABS: 4 | 60 days supply | Qty: 180 | Fill #1

## 2019-02-05 MED FILL — LIDOCAINE PATCH 5%: 5 | 30 days supply | Qty: 60 | Fill #1

## 2019-02-27 MED FILL — LEVOTHYROXINE 125 MCG TABLE: 125 | 90 days supply | Qty: 90 | Fill #1

## 2019-03-01 MED FILL — LIDOCAINE PATCH 5%: 5 | 30 days supply | Qty: 60 | Fill #0

## 2019-03-21 MED FILL — GABAPENTIN 300 MG CAPSULE: 300 | 30 days supply | Qty: 60 | Fill #4

## 2019-03-25 MED FILL — tiZANidine HCL 4 MG TABS: 4 | 60 days supply | Qty: 180 | Fill #0

## 2019-03-27 MED FILL — OMEPRAZOLE 40 MG CPDR: 40 | 90 days supply | Qty: 90 | Fill #2

## 2019-04-24 IMAGING — CR DG WRIST COMPLETE 3+V*R*
1 series · 4 of 4 positions shown · non-contrast
Comparison: None.

CLINICAL DATA: Tripped and fell onto the outstretched right hand
today. Pain and deformity.

EXAM:
RIGHT WRIST - COMPLETE 3+ VIEW

[Series 1: dg wrist complete right · 0.14mm/px · 4 of 4 slices shown]
[im 1/4]
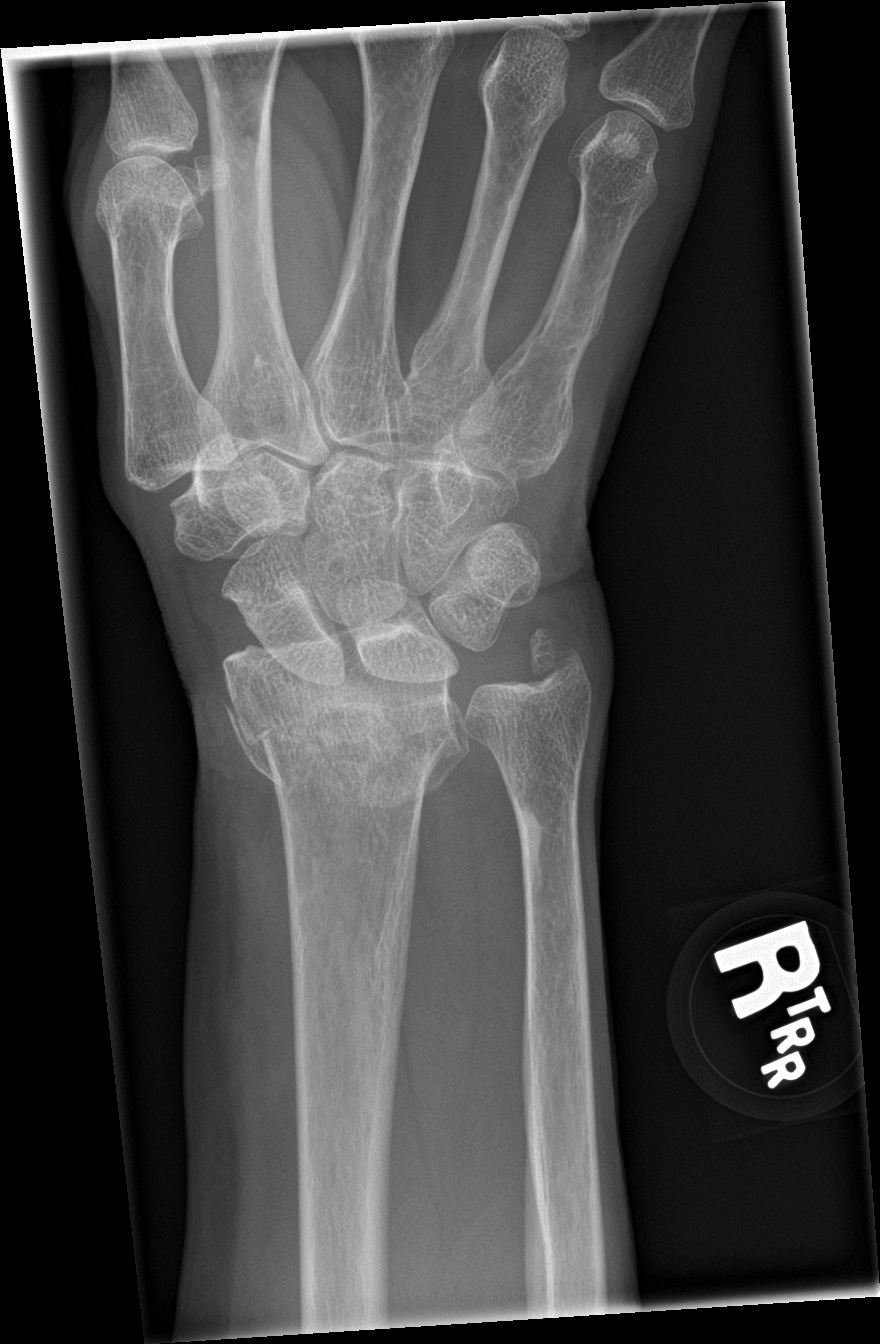
[im 2/4]
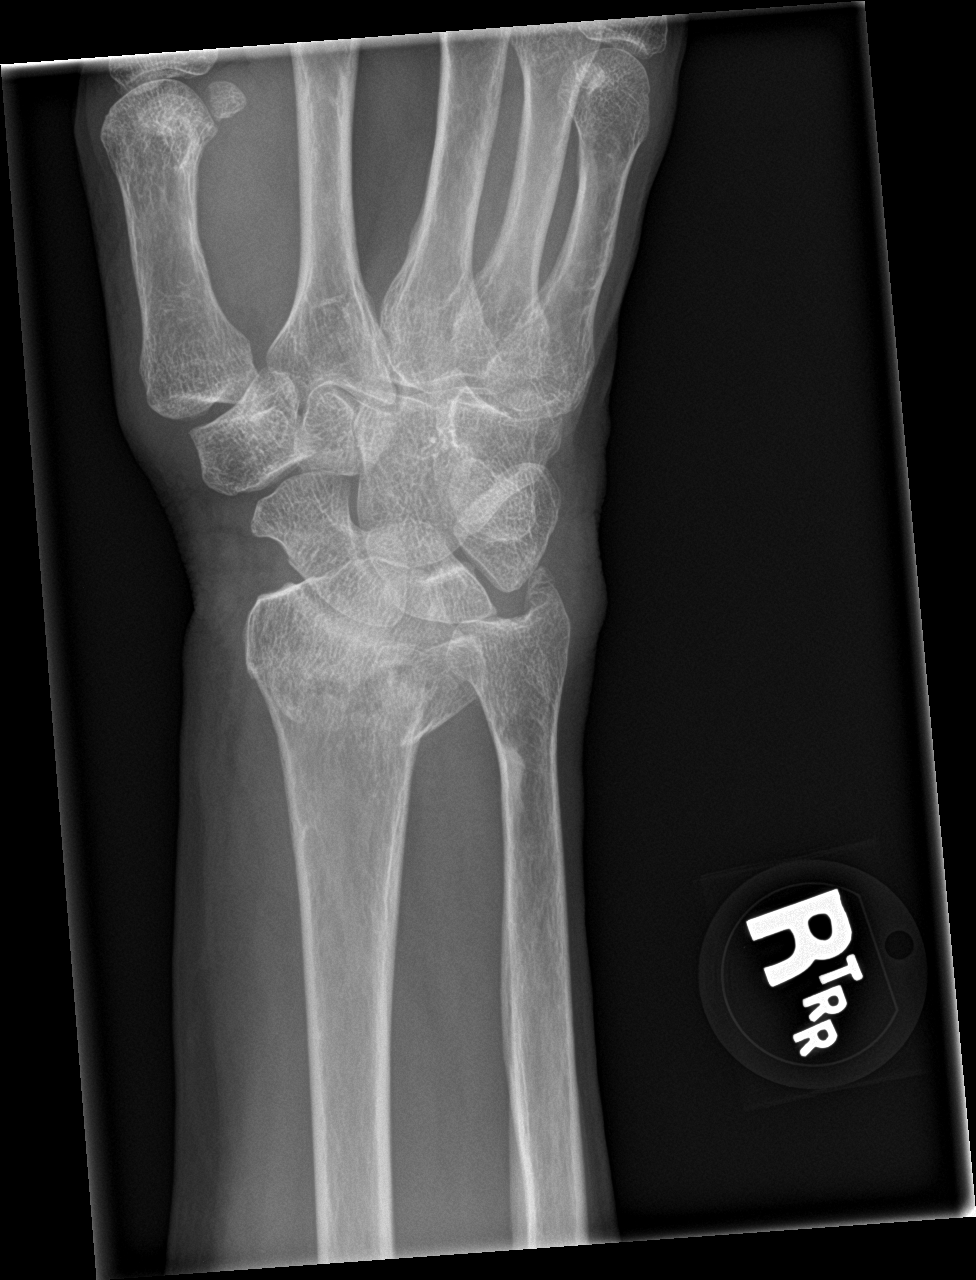
[im 3/4]
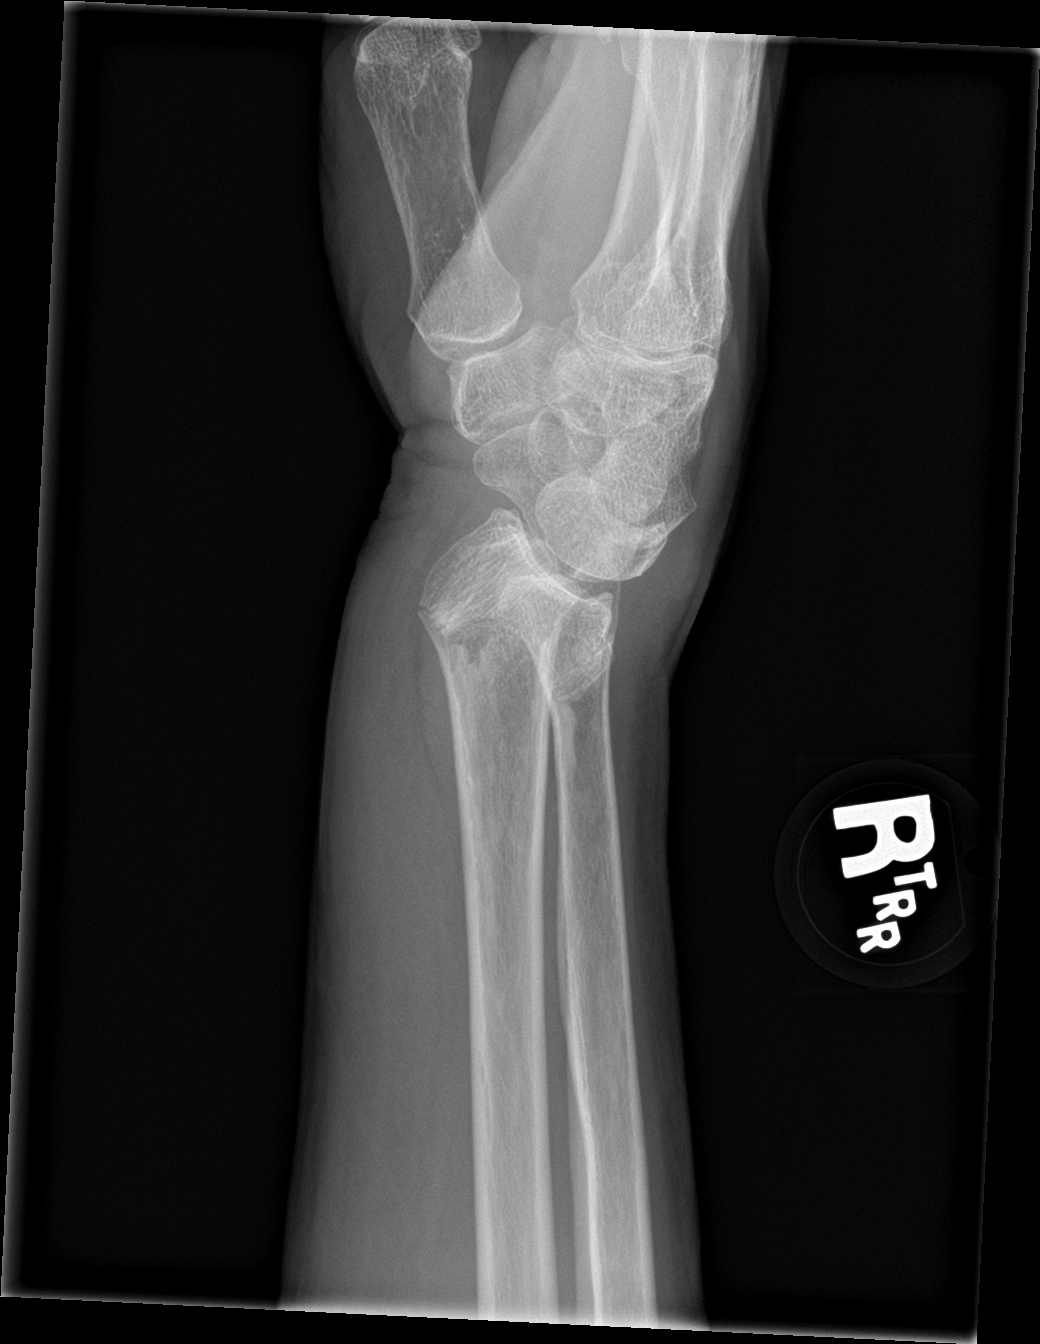
[im 4/4]
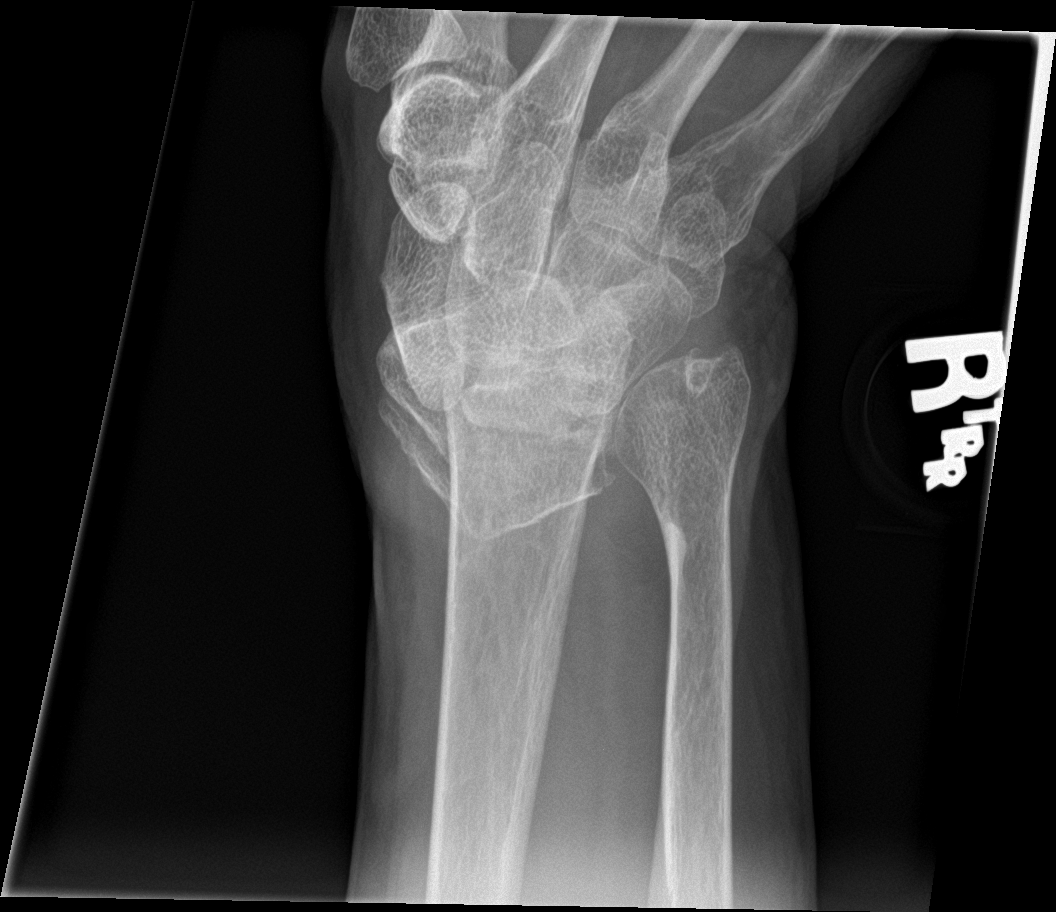

[4 of 4 positions shown; findings below may reference images not displayed]

FINDINGS: There is a comminuted fracture of the distal radial metaphysis
extending to the epiphysis. The primary fracture is transverse with
secondary fractures extending to the dorsal ulnar articular surface.
The fracture is impacted dorsally leading to significant dorsal
angulation of the distal radial articular surface, of 45 degrees.

There is associated ulnar styloid fracture without significant
displacement.

The joints are normally spaced and aligned.

There is significant surrounding soft tissue swelling.
IMPRESSION: 1. Comminuted, dorsally impacted and angulated, fracture of the
distal radial metaphysis with an intra-articular component.
2. Associated ulnar styloid fracture.
3. No dislocation.

## 2019-05-02 MED FILL — GABAPENTIN 300 MG CAPSULE: 300 | 30 days supply | Qty: 60 | Fill #0

## 2019-05-04 DIAGNOSIS — Z72820 Sleep deprivation: Secondary | ICD-10-CM | POA: Diagnosis not present

## 2019-05-04 DIAGNOSIS — N951 Menopausal and female climacteric states: Secondary | ICD-10-CM | POA: Diagnosis not present

## 2019-05-04 DIAGNOSIS — E039 Hypothyroidism, unspecified: Secondary | ICD-10-CM | POA: Diagnosis not present

## 2019-05-04 DIAGNOSIS — R232 Flushing: Secondary | ICD-10-CM | POA: Diagnosis not present

## 2019-05-04 DIAGNOSIS — M2550 Pain in unspecified joint: Secondary | ICD-10-CM | POA: Diagnosis not present

## 2019-05-08 DIAGNOSIS — N951 Menopausal and female climacteric states: Secondary | ICD-10-CM | POA: Diagnosis not present

## 2019-05-08 DIAGNOSIS — R232 Flushing: Secondary | ICD-10-CM | POA: Diagnosis not present

## 2019-05-08 DIAGNOSIS — N898 Other specified noninflammatory disorders of vagina: Secondary | ICD-10-CM | POA: Diagnosis not present

## 2019-06-05 DIAGNOSIS — R232 Flushing: Secondary | ICD-10-CM | POA: Diagnosis not present

## 2019-06-05 DIAGNOSIS — N898 Other specified noninflammatory disorders of vagina: Secondary | ICD-10-CM | POA: Diagnosis not present

## 2019-06-05 DIAGNOSIS — N951 Menopausal and female climacteric states: Secondary | ICD-10-CM | POA: Diagnosis not present

## 2019-06-08 DIAGNOSIS — E039 Hypothyroidism, unspecified: Secondary | ICD-10-CM | POA: Diagnosis not present

## 2019-06-08 DIAGNOSIS — Z72820 Sleep deprivation: Secondary | ICD-10-CM | POA: Diagnosis not present

## 2019-06-08 DIAGNOSIS — R5383 Other fatigue: Secondary | ICD-10-CM | POA: Diagnosis not present

## 2019-06-08 DIAGNOSIS — R232 Flushing: Secondary | ICD-10-CM | POA: Diagnosis not present

## 2019-06-08 MED FILL — tiZANidine HCL 4 MG TABS: 4 | 60 days supply | Qty: 180 | Fill #1

## 2019-06-08 MED FILL — LEVOTHYROXINE 125 MCG TABLE: 125 | 90 days supply | Qty: 90 | Fill #2

## 2019-06-08 MED FILL — GABAPENTIN 300 MG CAPSULE: 300 | 30 days supply | Qty: 60 | Fill #1

## 2019-06-11 ENCOUNTER — Other Ambulatory Visit: Payer: Self-pay | Admitting: Internal Medicine

## 2019-06-11 MED FILL — ALPRAZolam 0.25 MG TABS: 0.25 | 30 days supply | Qty: 90 | Fill #0

## 2019-06-26 DIAGNOSIS — M5441 Lumbago with sciatica, right side: Secondary | ICD-10-CM | POA: Diagnosis not present

## 2019-06-26 DIAGNOSIS — M7061 Trochanteric bursitis, right hip: Secondary | ICD-10-CM | POA: Diagnosis not present

## 2019-07-25 ENCOUNTER — Other Ambulatory Visit: Payer: Self-pay | Admitting: Internal Medicine

## 2019-07-25 DIAGNOSIS — Z1231 Encounter for screening mammogram for malignant neoplasm of breast: Secondary | ICD-10-CM

## 2019-07-25 MED FILL — LIDOCAINE PATCH 5%: 5 | 30 days supply | Qty: 60 | Fill #0

## 2019-07-27 MED FILL — GABAPENTIN 300 MG CAPSULE: 300 | 30 days supply | Qty: 60 | Fill #2

## 2019-07-27 MED FILL — tiZANidine HCL 4 MG TABS: 4 | 60 days supply | Qty: 180 | Fill #0

## 2019-07-30 MED FILL — LIDOCAINE PATCH 5%: 5 | 30 days supply | Qty: 60 | Fill #0

## 2019-08-07 ENCOUNTER — Other Ambulatory Visit: Payer: Self-pay

## 2019-08-07 ENCOUNTER — Ambulatory Visit
Admission: RE | Admit: 2019-08-07 | Discharge: 2019-08-07 | Disposition: A | Discharge status: Home / Self Care | Payer: Medicare Other | Source: Ambulatory Visit | Attending: Internal Medicine | Admitting: Internal Medicine

## 2019-08-07 DIAGNOSIS — Z1231 Encounter for screening mammogram for malignant neoplasm of breast: Secondary | ICD-10-CM | POA: Insufficient documentation

## 2019-08-29 MED FILL — LEVOTHYROXINE 125 MCG TABLE: 125 | 90 days supply | Qty: 90 | Fill #3

## 2019-08-29 MED FILL — ALPRAZolam 0.25 MG TABS: 0.25 | 30 days supply | Qty: 90 | Fill #0

## 2019-10-22 ENCOUNTER — Encounter: Payer: Self-pay | Admitting: Plastic Surgery

## 2019-10-22 ENCOUNTER — Other Ambulatory Visit: Payer: Self-pay

## 2019-10-22 ENCOUNTER — Ambulatory Visit (INDEPENDENT_AMBULATORY_CARE_PROVIDER_SITE_OTHER): Payer: Self-pay | Admitting: Plastic Surgery

## 2019-10-22 DIAGNOSIS — Z719 Counseling, unspecified: Secondary | ICD-10-CM | POA: Insufficient documentation

## 2019-10-22 NOTE — Progress Notes (Signed)
Patient ID: Erin Bruce, female    DOB: Sep 18, 1955, 64 y.o.   MRN: [id number]   Chief Complaint  Patient presents with  . Advice Only    The patient is a 64 year old female here with her sister for evaluation.  She is interested in some facial rejuvenation.  She has had some filler and Botox in the past.  She is interested in some touch up.  She is also interested in reducing her abdominal thickness. She is 5 feet 6 inches tall and she is 147 pounds in weight.  She is not a smoker and is otherwise healthy.  She has been helping her sister who is going through breast cancer treatment.  She does not have plans for any significant weight change.  History of hypertension and thyroid disease.  Those are both stable at this time.  On exam she does not have a palpable hernia.   Review of Systems  Constitutional: Negative.   HENT: Negative.   Eyes: Negative.   Respiratory: Negative.   Cardiovascular: Negative.   Gastrointestinal: Negative.   Genitourinary: Negative.   Musculoskeletal: Negative.   Hematological: Negative.   Psychiatric/Behavioral: Negative.     Past Medical History:  Diagnosis Date  . GERD (gastroesophageal reflux disease)   . Hypertension   . Hypothyroidism   . Thyroid disease     Past Surgical History:  Procedure Laterality Date  . BREAST BIOPSY Left    bx/clip- neg  . MENISCUS REPAIR Right   . OPEN REDUCTION INTERNAL FIXATION (ORIF) DISTAL RADIAL FRACTURE Right 08/17/2016   Procedure: OPEN REDUCTION INTERNAL FIXATION (ORIF) DISTAL RADIAL FRACTURE;  Surgeon: Juanell Fairly, MD;  Location: [facility] ORS;  Service: Orthopedics;  Laterality: Right;  . SHOULDER SURGERY Right    x4      Current Outpatient Medications:  .  amLODipine (NORVASC) 10 MG tablet, Take 10 mg by mouth daily., Disp: , Rfl:  .  Eszopiclone 3 MG TABS, TAKE 1 TABLET BY MOUTH ONCE DAILY AT BEDTIME AS NEEDED, Disp: , Rfl:  .  fluticasone (FLONASE) 50 MCG/ACT nasal spray, Place into the nose.,  Disp: , Rfl:  .  gabapentin (NEURONTIN) 300 MG capsule, Take by mouth., Disp: , Rfl:  .  levothyroxine (SYNTHROID, LEVOTHROID) 150 MCG tablet, Take 150 mcg by mouth daily before breakfast., Disp: , Rfl:  .  Multiple Vitamin (MULTIVITAMIN) tablet, Take 1 tablet by mouth daily., Disp: , Rfl:  .  omeprazole (PRILOSEC) 40 MG capsule, Take 40 mg by mouth every morning. , Disp: , Rfl: 1 .  tiZANidine (ZANAFLEX) 4 MG tablet, Take 4 mg by mouth every 6 (six) hours as needed. , Disp: , Rfl:  .  zolpidem (AMBIEN CR) 6.25 MG CR tablet, Take 6.25 mg by mouth at bedtime as needed for sleep., Disp: , Rfl:    Objective:   Vitals:   10/22/19 1107  BP: (!) 145/80  Pulse: 76  Temp: 98 F (36.7 C)  SpO2: 98%    Physical Exam Vitals and nursing note reviewed.  Constitutional:      Appearance: Normal appearance.  HENT:     Head: Normocephalic and atraumatic.  Cardiovascular:     Rate and Rhythm: Normal rate.     Pulses: Normal pulses.  Pulmonary:     Effort: Pulmonary effort is normal.  Neurological:     General: No focal deficit present.     Mental Status: She is alert and oriented to person, place, and time.  Psychiatric:  Mood and Affect: Mood normal.        Behavior: Behavior normal.        Thought Content: Thought content normal.     Assessment & Plan:  Encounter for counseling  We are happy to do Botox and filler as needed.  She is going to make an appointment for that.  The estimated quote was given for that.  We also gave her a quote for a mini abdominoplasty with liposuction.  We will wait to hear back from her.  Alena Bills Latessa Tillis, DO

## 2019-10-24 ENCOUNTER — Telehealth: Payer: Self-pay | Admitting: Plastic Surgery

## 2019-10-24 NOTE — Telephone Encounter (Signed)
Patient lvm following up on the prednisone cream for the rash on her face that Dr. Ulice Bold was supposed to call in. The pharmacy has not received it yet. She also wanted to know when she would be contacted by [facility] about surgery. Message sent to [facility] about scheduling. Please call patient to advise about prescription.

## 2019-10-25 MED ORDER — FLUOCINONIDE-E 0.05 % EX CREA: 1.0000 "application " | TOPICAL_CREAM | Freq: Two times a day (BID) | CUTANEOUS | 0 refills | Status: AC

## 2019-10-25 NOTE — Addendum Note (Signed)
Addended by: Peggye Form on: 10/25/2019 03:27 PM   Modules accepted: Orders

## 2019-10-26 MED FILL — tiZANidine HCL 4 MG TABS: 4 | 60 days supply | Qty: 180 | Fill #1

## 2019-10-26 MED FILL — ALPRAZolam 0.25 MG TABS: 0.25 | 30 days supply | Qty: 90 | Fill #1

## 2019-10-26 MED FILL — GABAPENTIN 300 MG CAPSULE: 300 | 30 days supply | Qty: 60 | Fill #3

## 2019-10-29 NOTE — Progress Notes (Signed)
ICD-10-CM   1. Encounter for counseling  Z71.9       Patient ID: Erin Bruce, female    DOB: [birth year], 64 y.o.   MRN: [id number]   History of Present Illness: Erin Bruce is a 64 y.o.  female  with a history of abdominal thickness.  She presents for preoperative evaluation for upcoming procedure, mini abdominoplasty with liposuction, scheduled for 11/19/2019 with Dr. Ulice Bold.  Summary from previous visit: Patient is interested in reducing her abdominal thickness.  She is 5 feet 6 inches tall and weighs 147 pounds.  She has no plans for any significant weight change.  No palpable hernia.   Job: Retired  PMH Significant for: HTN, hypothyroid; both well controlled  The patient has not had problems with anesthesia.   Past Medical History: Allergies: Allergies  Allergen Reactions  . Acyclovir And Related   . Alendronate     Other reaction(s): Other (See Comments) Dyspepsia/esophagitis  . Buspirone     Other reaction(s): Other (See Comments) confusion  . Diltiazem     Other reaction(s): Other (See Comments) Leg edema  . Losartan     tachycardia  . Tramadol Palpitations    Current Medications:  Current Outpatient Medications:  .  amLODipine (NORVASC) 10 MG tablet, Take 10 mg by mouth daily., Disp: , Rfl:  .  Eszopiclone 3 MG TABS, TAKE 1 TABLET BY MOUTH ONCE DAILY AT BEDTIME AS NEEDED, Disp: , Rfl:  .  fluocinonide-emollient (LIDEX-E) 0.05 % cream, Apply 1 application topically 2 (two) times daily., Disp: 30 g, Rfl: 0 .  fluticasone (FLONASE) 50 MCG/ACT nasal spray, Place into the nose., Disp: , Rfl:  .  gabapentin (NEURONTIN) 300 MG capsule, Take by mouth., Disp: , Rfl:  .  levothyroxine (SYNTHROID, LEVOTHROID) 150 MCG tablet, Take 150 mcg by mouth daily before breakfast., Disp: , Rfl:  .  Multiple Vitamin (MULTIVITAMIN) tablet, Take 1 tablet by mouth daily., Disp: , Rfl:  .  omeprazole (PRILOSEC) 40 MG capsule, Take 40 mg by mouth every morning. , Disp: , Rfl:  1 .  tiZANidine (ZANAFLEX) 4 MG tablet, Take 4 mg by mouth every 6 (six) hours as needed. , Disp: , Rfl:  .  zolpidem (AMBIEN CR) 6.25 MG CR tablet, Take 6.25 mg by mouth at bedtime as needed for sleep., Disp: , Rfl:   Past Medical Problems: Past Medical History:  Diagnosis Date  . GERD (gastroesophageal reflux disease)   . Hypertension   . Hypothyroidism   . Thyroid disease     Past Surgical History: Past Surgical History:  Procedure Laterality Date  . BREAST BIOPSY Left    bx/clip- neg  . MENISCUS REPAIR Right   . OPEN REDUCTION INTERNAL FIXATION (ORIF) DISTAL RADIAL FRACTURE Right 08/17/2016   Procedure: OPEN REDUCTION INTERNAL FIXATION (ORIF) DISTAL RADIAL FRACTURE;  Surgeon: Juanell Fairly, MD;  Location: [facility] ORS;  Service: Orthopedics;  Laterality: Right;  . SHOULDER SURGERY Right    x4    Social History: Social History   Socioeconomic History  . Marital status: Single    Spouse name: Not on file  . Number of children: Not on file  . Years of education: Not on file  . Highest education level: Not on file  Occupational History  . Not on file  Tobacco Use  . Smoking status: Never Smoker  . Smokeless tobacco: Never Used  Substance and Sexual Activity  . Alcohol use: Yes    Alcohol/week: 0.0 standard drinks  Comment: very rare  . Drug use: No  . Sexual activity: Not on file  Other Topics Concern  . Not on file  Social History Narrative  . Not on file   Social Determinants of Health   Financial Resource Strain:   . Difficulty of Paying Living Expenses:   Food Insecurity:   . Worried About Programme researcher, broadcasting/film/video in the Last Year:   . Barista in the Last Year:   Transportation Needs:   . Freight forwarder (Medical):   Marland Kitchen Lack of Transportation (Non-Medical):   Physical Activity:   . Days of Exercise per Week:   . Minutes of Exercise per Session:   Stress:   . Feeling of Stress :   Social Connections:   . Frequency of Communication with  Friends and Family:   . Frequency of Social Gatherings with Friends and Family:   . Attends Religious Services:   . Active Member of Clubs or Organizations:   . Attends Banker Meetings:   Marland Kitchen Marital Status:   Intimate Partner Violence:   . Fear of Current or Ex-Partner:   . Emotionally Abused:   Marland Kitchen Physically Abused:   . Sexually Abused:     Family History: Family History  Problem Relation Age of Onset  . Breast cancer Neg Hx     Review of Systems: Review of Systems  Constitutional: Negative for chills and fever.  HENT: Negative for congestion and sore throat.   Respiratory: Negative for cough and shortness of breath.   Cardiovascular: Negative for chest pain and palpitations.  Gastrointestinal: Negative for abdominal pain, nausea and vomiting.  Musculoskeletal: Negative for back pain, joint pain, myalgias and neck pain.  Skin: Negative for itching and rash.       Excess skin and thickness of abdomen    Physical Exam: Vital Signs BP 121/65 (BP Location: Left Arm, Patient Position: Sitting, Cuff Size: Large)   Pulse 65   Temp (!) 97.5 F (36.4 C) (Oral)   Ht 5\' 6"  (1.676 m)   Wt 145 lb (65.8 kg)   SpO2 97%   BMI 23.40 kg/m  Physical Exam Constitutional:      Appearance: Normal appearance. She is normal weight.  HENT:     Head: Normocephalic and atraumatic.  Eyes:     Extraocular Movements: Extraocular movements intact.  Cardiovascular:     Rate and Rhythm: Normal rate and regular rhythm.     Pulses: Normal pulses.     Heart sounds: Normal heart sounds.  Pulmonary:     Effort: Pulmonary effort is normal.     Breath sounds: Normal breath sounds. No wheezing, rhonchi or rales.  Abdominal:     General: Bowel sounds are normal.     Palpations: Abdomen is soft.     Comments: Excess skin and thickness of abdomen  Musculoskeletal:        General: No swelling. Normal range of motion.     Cervical back: Normal range of motion.  Skin:    General: Skin  is warm and dry.     Coloration: Skin is not pale.     Findings: No erythema or rash.  Neurological:     General: No focal deficit present.     Mental Status: She is alert and oriented to person, place, and time.  Psychiatric:        Mood and Affect: Mood normal.        Behavior: Behavior normal.  Thought Content: Thought content normal.        Judgment: Judgment normal.     Assessment/Plan:  Erin Bruce scheduled for mini abdominoplasty with liposuction with Dr. Ulice Bold.  Risks, benefits, and alternatives of procedure discussed, questions answered and consent obtained.    Smoking Status: non-smoker; Counseling Given? N/A  Caprini Score: 7 High; Risk Factors include: 64 year old female, BMI < 25, and length of planned surgery. Recommendation for mechanical and pharmacological prophylaxis during surgery. Encourage early ambulation.   Pictures obtained: 10/22/2019  Post-op Rx sent to pharmacy: Norco, Zofran, keflex Counseled patient on the sedation side effects of hydrocodone as well as her current prescriptions of Ambien and tizanidine.  Cautioned her to not take these together.  Patient was provided with the General Surgical Risks consent document and Pain Medication Agreement prior to their appointment.  They had adequate time to read through the risk consent documents and Pain Medication Agreement. We also discussed them in person together during this preop appointment. All of their questions were answered to their satisfaction.  Recommended calling if they have any further questions.  Risk consent form and Pain Medication Agreement to be scanned into patient's chart.  The risk that can be encountered for this procedure were discussed and include the following but not limited to these: asymmetry, fluid accumulation, firmness of the tissue, skin loss, decrease or no sensation, fat necrosis, bleeding, infection, healing delay.  Deep vein thrombosis, cardiac and pulmonary  complications are risks to any procedure.  There are risks of anesthesia, changes to skin sensation and injury to nerves or blood vessels.  The muscle can be temporarily or permanently injured.  You may have an allergic reaction to tape, suture, glue, blood products which can result in skin discoloration, swelling, pain, skin lesions, poor healing.  Any of these can lead to the need for revisonal surgery or stage procedures.  Weight gain and weigh loss can also effect the long term appearance. The results are not guaranteed to last a lifetime.  Future surgery may be required.    The risks that can be encountered with and after liposuction were discussed and include the following but no limited to these:  Asymmetry, fluid accumulation, firmness of the area, fat necrosis with death of fat tissue, bleeding, infection, delayed healing, anesthesia risks, skin sensation changes, injury to structures including nerves, blood vessels, and muscles which may be temporary or permanent, allergies to tape, suture materials and glues, blood products, topical preparations or injected agents, skin and contour irregularities, skin discoloration and swelling, deep vein thrombosis, cardiac and pulmonary complications, pain, which may persist, persistent pain, recurrence of the lesion, poor healing of the incision, possible need for revisional surgery or staged procedures. Thiere can also be persistent swelling, poor wound healing, rippling or loose skin, worsening of cellulite, swelling, and thermal burn or heat injury from ultrasound with the ultrasound-assisted lipoplasty technique. Any change in weight fluctuations can alter the outcome.  The 21st Century Cures Act was signed into law in 2016 which includes the topic of electronic health records.  This provides immediate access to information in MyChart.  This includes consultation notes, operative notes, office notes, lab results and pathology reports.  If you have any  questions about what you read please let us know at your next visit or call us at the office.  We are right here with you.   Electronically signed by: Eldridge Abrahams, PA-C 10/30/2019 1:01 PM

## 2019-10-30 ENCOUNTER — Other Ambulatory Visit: Payer: Self-pay

## 2019-10-30 ENCOUNTER — Encounter: Payer: Self-pay | Admitting: Plastic Surgery

## 2019-10-30 ENCOUNTER — Ambulatory Visit (INDEPENDENT_AMBULATORY_CARE_PROVIDER_SITE_OTHER): Payer: Self-pay | Admitting: Plastic Surgery

## 2019-10-30 VITALS — BP 121/65 | HR 65 | Temp 97.5°F | Ht 66.0 in | Wt 145.0 lb

## 2019-10-30 DIAGNOSIS — Z719 Counseling, unspecified: Secondary | ICD-10-CM

## 2019-10-30 MED ORDER — ONDANSETRON HCL 4 MG PO TABS: 4.0000 mg | ORAL_TABLET | Freq: Three times a day (TID) | ORAL | 0 refills | Status: DC | PRN

## 2019-10-30 MED ORDER — HYDROCODONE-ACETAMINOPHEN 5-325 MG PO TABS: 1.0000 | ORAL_TABLET | Freq: Three times a day (TID) | ORAL | 0 refills | Status: AC | PRN

## 2019-10-30 MED ORDER — CEPHALEXIN 500 MG PO CAPS: 500.0000 mg | ORAL_CAPSULE | Freq: Four times a day (QID) | ORAL | 0 refills | Status: AC

## 2019-10-30 NOTE — Addendum Note (Signed)
Addended by: Joni Fears on: 10/30/2019 01:06 PM   Modules accepted: Orders

## 2019-11-15 MED FILL — ALPRAZolam 0.25 MG TABS: 0.25 | 30 days supply | Qty: 90 | Fill #1

## 2019-11-15 MED FILL — tiZANidine HCL 4 MG TABS: 4 | 60 days supply | Qty: 180 | Fill #1

## 2019-11-16 ENCOUNTER — Encounter: Payer: Medicare Other | Admitting: Plastic Surgery

## 2019-11-16 MED FILL — LEVOTHYROXINE 125 MCG TABLE: 125 | 90 days supply | Qty: 90 | Fill #0

## 2019-11-23 ENCOUNTER — Telehealth (INDEPENDENT_AMBULATORY_CARE_PROVIDER_SITE_OTHER): Payer: Medicare Other | Admitting: Plastic Surgery

## 2019-11-23 ENCOUNTER — Telehealth: Payer: Self-pay | Admitting: Plastic Surgery

## 2019-11-23 DIAGNOSIS — Z719 Counseling, unspecified: Secondary | ICD-10-CM

## 2019-11-23 MED ORDER — HYDROCODONE-ACETAMINOPHEN 5-325 MG PO TABS: 1.0000 | ORAL_TABLET | Freq: Four times a day (QID) | ORAL | 0 refills | Status: AC | PRN

## 2019-11-23 MED ORDER — ONDANSETRON HCL 4 MG PO TABS: 4.0000 mg | ORAL_TABLET | Freq: Three times a day (TID) | ORAL | 0 refills | Status: DC | PRN

## 2019-11-23 NOTE — Telephone Encounter (Signed)
Patient's sister, Erin Bruce, called in to advise that Erin Bruce is in a lot of pain and doesn't eat/drink because it makes the pain worse. She needs something stronger to help with her pain. Please call sister, Erin Bruce, to advise. Patient uses [employer].

## 2019-11-23 NOTE — Telephone Encounter (Signed)
Patient returned Erin Bruce's call regarding pain management placed by her sister this morning.

## 2019-11-27 ENCOUNTER — Other Ambulatory Visit: Payer: Self-pay

## 2019-11-27 ENCOUNTER — Ambulatory Visit (INDEPENDENT_AMBULATORY_CARE_PROVIDER_SITE_OTHER): Payer: Medicare Other | Admitting: Plastic Surgery

## 2019-11-27 ENCOUNTER — Encounter: Payer: Self-pay | Admitting: Plastic Surgery

## 2019-11-27 VITALS — BP 119/67 | HR 93 | Temp 98.3°F

## 2019-11-27 DIAGNOSIS — Z719 Counseling, unspecified: Secondary | ICD-10-CM

## 2019-11-27 MED ORDER — HYDROCODONE-ACETAMINOPHEN 5-325 MG PO TABS: 1.0000 | ORAL_TABLET | Freq: Two times a day (BID) | ORAL | 0 refills | Status: AC | PRN

## 2019-11-27 NOTE — Addendum Note (Signed)
Addended by: Peggye Form on: 11/27/2019 12:31 PM   Modules accepted: Orders

## 2019-11-27 NOTE — Progress Notes (Addendum)
   Subjective:    Patient ID: [name], female    DOB: 01-Mar-1956, 64 y.o.   MRN: [id number]  The patient is a 64 year old female here for follow-up after undergoing an abdominoplasty.  She states that there has been quite a lot of pain.  This is normal.  It is getting a little bit better with time.  The drain output is still around 300 total.  Her bruising is minimal and the swelling is as expected.  The incisions are still covered with the Aquacel.  I removed to the other dressings.       Review of Systems  Constitutional: Negative.   HENT: Negative.   Eyes: Negative.   Respiratory: Negative.   Cardiovascular: Negative.   Gastrointestinal: Negative.   Genitourinary: Negative.        Objective:   Physical Exam Vitals and nursing note reviewed.  Constitutional:      Appearance: Normal appearance.  HENT:     Head: Normocephalic and atraumatic.  Cardiovascular:     Rate and Rhythm: Normal rate.     Pulses: Normal pulses.  Neurological:     General: No focal deficit present.     Mental Status: She is alert. Mental status is at baseline.  Psychiatric:        Mood and Affect: Mood normal.        Behavior: Behavior normal.        Assessment & Plan:     ICD-10-CM   1. Encounter for counseling  Z71.9     She can shower in the next few days.  We will plan to remove the drains hopefully next week or at least one of them. Refill given on the pain medication.

## 2019-11-30 DIAGNOSIS — Z719 Counseling, unspecified: Secondary | ICD-10-CM

## 2019-12-03 NOTE — Progress Notes (Signed)
Patient is a 64 yr-old female here for follow-up after undergoing an abdominoplasty with liposuction on 11/19/19 with Dr. Ulice Bold. At last visit she was given a refill of pain medication.  ~ 2 weeks PO Patient presents today with her sister.  Complains of significant pain with drains and where some skin pulled off when the dressings were removed.  All incisions are healing very nicely, C/D/I.  Erythema present on the skin between the bellybutton and the incision where some skin has been pulled off when the dressing was removed; tender to palpation.  Patient denies fever, chills, nausea/vomiting.  Patient reports that the left drain has been putting out 5 to 10 cc for the last several days.  She reports that this is the side that hurts her the most.  A good amount of erythema present around the drain site.  Patient reports the right drain has been putting out approximately 30 to 40 cc/day.  Left drain removed today due to minimal output as well as significant pain being caused to the patient.  Apply Xeroform or Vaseline to the skin wounds below the bellybutton.  Cover with ABD or other kind of absorbent dressing.  Continue to wear abdominal binder 24/7 till 6 weeks postop.  Patient requested additional pain medication, a small amount was provided and the patient was advised to try to maintain pain control on Tylenol and ibuprofen as much as possible.  Sent in Rx for doxy. Follow-up next week for right drain removal.  If output becomes less than 20 cc for a few days she should call the office and return sooner for drain removal.  Call office with any questions/concerns.   Pictures were obtained of the patient and placed in the chart with the patient's or guardian's permission.  The 21st Century Cures Act was signed into law in 2016 which includes the topic of electronic health records.  This provides immediate access to information in MyChart.  This includes consultation notes, operative notes, office  notes, lab results and pathology reports.  If you have any questions about what you read please let us know at your next visit or call us at the office.  We are right here with you.

## 2019-12-04 ENCOUNTER — Encounter: Payer: Self-pay | Admitting: Plastic Surgery

## 2019-12-04 ENCOUNTER — Other Ambulatory Visit: Payer: Self-pay

## 2019-12-04 ENCOUNTER — Ambulatory Visit (INDEPENDENT_AMBULATORY_CARE_PROVIDER_SITE_OTHER): Payer: Medicare Other | Admitting: Plastic Surgery

## 2019-12-04 VITALS — BP 124/66 | HR 95 | Temp 98.4°F

## 2019-12-04 DIAGNOSIS — Z719 Counseling, unspecified: Secondary | ICD-10-CM

## 2019-12-04 MED ORDER — DOXYCYCLINE HYCLATE 100 MG PO TABS: 100.0000 mg | ORAL_TABLET | Freq: Two times a day (BID) | ORAL | 0 refills | Status: AC

## 2019-12-04 MED ORDER — HYDROCODONE-ACETAMINOPHEN 5-325 MG PO TABS: 1.0000 | ORAL_TABLET | Freq: Two times a day (BID) | ORAL | 0 refills | Status: AC | PRN

## 2019-12-07 ENCOUNTER — Ambulatory Visit: Payer: Medicare Other | Admitting: Plastic Surgery

## 2019-12-09 NOTE — Progress Notes (Signed)
Patient is a 64 year old female here for follow-up after undergoing an abdominoplasty with liposuction on 11/19/2019 with Dr. Ulice Bold.  At last visit on 8/24 she reported pain where some skin pulled off when the dressing was removed as well as significant pain with the drains.  She was instructed to put Xeroform or Vaseline on the skin wounds below the bellybutton and was sent in an Rx for doxycycline.  She has had 2 refills of pain medication.  ~ 3 weeks PO Patient reports overall she is not feeling great.  Patient reports drain output is about 5 to 10 cc/day and she is hoping to get it out today.  Reports continued pain from the wound in the center of the incision.  She reports she has been taking the doxycycline along with a probiotic but she is left unable to eat.  Removed drain today.  Drain site has some mild irritation redness but no signs of infection.  Wound above the center part of her incision has increased in size.  Erythema surrounding the wound and some drainage present.  All other parts of the incision are healing well, C/D/I.  Bellybutton incision is healing well.   Do daily dressing changes for the wound and the drain site consisting of Vaseline and gauze.  May continue to shower.  Continue to wear binder 24/7 and avoid full extension of the torso.  Sent Rx to pharmacy for Bactrim.  Continue taking probiotic.  Encourage patient to make sure she is eating and drinking enough fluids.  Follow-up in 1 week.  Call office with any questions/concerns.   Pictures were obtained of the patient and placed in the chart with the patient's or guardian's permission.  The 21st Century Cures Act was signed into law in 2016 which includes the topic of electronic health records.  This provides immediate access to information in MyChart.  This includes consultation notes, operative notes, office notes, lab results and pathology reports.  If you have any questions about what you read please let us know at  your next visit or call us at the office.  We are right here with you.

## 2019-12-10 ENCOUNTER — Other Ambulatory Visit: Payer: Self-pay

## 2019-12-10 ENCOUNTER — Encounter: Payer: Self-pay | Admitting: Plastic Surgery

## 2019-12-10 ENCOUNTER — Ambulatory Visit (INDEPENDENT_AMBULATORY_CARE_PROVIDER_SITE_OTHER): Payer: Medicare Other | Admitting: Plastic Surgery

## 2019-12-10 VITALS — BP 133/61 | HR 88 | Temp 97.7°F

## 2019-12-10 DIAGNOSIS — Z9889 Other specified postprocedural states: Secondary | ICD-10-CM

## 2019-12-10 MED ORDER — SULFAMETHOXAZOLE-TRIMETHOPRIM 800-160 MG PO TABS
1.0000 | ORAL_TABLET | Freq: Two times a day (BID) | ORAL | 0 refills | Status: AC
Start: 2019-12-10 — End: 2019-12-20

## 2019-12-11 ENCOUNTER — Encounter: Payer: Medicare Other | Admitting: Plastic Surgery

## 2019-12-13 MED FILL — tiZANidine HCL 4 MG TABS: 4 | 60 days supply | Qty: 180 | Fill #1

## 2019-12-13 MED FILL — GABAPENTIN 300 MG CAPSULE: 300 | 30 days supply | Qty: 60 | Fill #3

## 2019-12-14 ENCOUNTER — Telehealth: Payer: Self-pay | Admitting: Plastic Surgery

## 2019-12-14 NOTE — Telephone Encounter (Signed)
Patient lvm stating concern regarding her post surgical infection site and not being able to slow the tissue breakdown. Drainage is pouring out of site. Please call to let her know what to do.

## 2019-12-14 NOTE — Telephone Encounter (Signed)
Spoke with patient. Reports wound had increased in size and now has opened part of the incision. Patient is currently on Abx; recommend she make sure she is taking these. Continue to use vaseline and gauze over the wound.  Make sure she is eating a drinking well to keep up her nutritional status. Recommend taking a multivitamin.  Continue to wear binder. Continue to avoid fully straightening to keep tension off the incision. She should keep her appointment with [place] this coming Tuesday.

## 2019-12-17 ENCOUNTER — Encounter: Payer: Self-pay | Admitting: Plastic Surgery

## 2019-12-17 NOTE — Progress Notes (Signed)
The patient is a very sweet 64 year old female here for follow-up on her abdominoplasty from 3 weeks ago.  Overall she is doing well.  She had some skin irritation and loss secondary to the dressing.  This was in the area in between the umbilicus and the incision. There is some necrotic tissue today. I cleaned it with Betadine and started to debride some of it. She tolerated it very well. She had a large seroma that drained. I think that this will help a great deal. She should continue with the Vaseline and Xeroform dressing. She should expect a fair bit of drainage. It was all seroma serous in nature, nothing bloody. I would like to see her back in 1 week.

## 2019-12-18 ENCOUNTER — Other Ambulatory Visit: Payer: Self-pay

## 2019-12-18 ENCOUNTER — Encounter: Payer: Self-pay | Admitting: Plastic Surgery

## 2019-12-18 ENCOUNTER — Ambulatory Visit (INDEPENDENT_AMBULATORY_CARE_PROVIDER_SITE_OTHER): Payer: Medicare Other | Admitting: Plastic Surgery

## 2019-12-18 VITALS — BP 146/87 | HR 87 | Temp 97.9°F

## 2019-12-18 DIAGNOSIS — Z719 Counseling, unspecified: Secondary | ICD-10-CM

## 2019-12-19 MED FILL — LEVOTHYROXINE 125 MCG TABLE: 125 | 90 days supply | Qty: 90 | Fill #0

## 2019-12-25 ENCOUNTER — Ambulatory Visit (INDEPENDENT_AMBULATORY_CARE_PROVIDER_SITE_OTHER): Payer: Medicare Other | Admitting: Plastic Surgery

## 2019-12-25 ENCOUNTER — Other Ambulatory Visit: Payer: Self-pay

## 2019-12-25 ENCOUNTER — Encounter: Payer: Self-pay | Admitting: Plastic Surgery

## 2019-12-25 VITALS — BP 104/56 | HR 104 | Temp 98.2°F

## 2019-12-25 DIAGNOSIS — Z719 Counseling, unspecified: Secondary | ICD-10-CM

## 2019-12-25 NOTE — Progress Notes (Signed)
   Subjective:    Patient ID: Erin Bruce, female    DOB: 10/06/1955, 64 y.o.   MRN: [id number]  The patient is a 64 year old female here for follow-up on her abdominoplasty. She had some breakdown at the midline incision area. This is improving and her drainage has been a lot less. She is starting to granulate on the sides. Nothing looks infected and the swelling is markedly improved     Review of Systems  Constitutional: Positive for activity change.  HENT: Negative.   Eyes: Negative.   Respiratory: Negative.   Cardiovascular: Negative.   Gastrointestinal: Negative for abdominal pain.  Genitourinary: Negative.   Skin: Positive for wound.       Objective:   Physical Exam Constitutional:      Appearance: Normal appearance.  HENT:     Head: Normocephalic and atraumatic.  Cardiovascular:     Rate and Rhythm: Normal rate.     Pulses: Normal pulses.  Pulmonary:     Effort: Pulmonary effort is normal.  Neurological:     General: No focal deficit present.     Mental Status: She is alert and oriented to person, place, and time.  Psychiatric:        Mood and Affect: Mood normal.        Behavior: Behavior normal.        Assessment & Plan:     ICD-10-CM   1. Encounter for counseling  Z71.9     Continue with the dressing changes daily. Donated ACell was applied. She should do [place] dressing changes daily for 1 week. I would like to see her back in 1 week. She is in agreement with this plan

## 2020-01-01 ENCOUNTER — Other Ambulatory Visit: Payer: Self-pay

## 2020-01-01 ENCOUNTER — Encounter: Payer: Self-pay | Admitting: Plastic Surgery

## 2020-01-01 ENCOUNTER — Ambulatory Visit (INDEPENDENT_AMBULATORY_CARE_PROVIDER_SITE_OTHER): Payer: Medicare Other | Admitting: Plastic Surgery

## 2020-01-01 VITALS — BP 129/84 | HR 80 | Temp 97.5°F

## 2020-01-01 DIAGNOSIS — Z719 Counseling, unspecified: Secondary | ICD-10-CM

## 2020-01-01 NOTE — Progress Notes (Signed)
The patient is a 64 year old female here for follow-up after undergoing abdominoplasty.  She has a wound in the midline.  This is improving a great deal.  No sign of infection.  I went ahead and put donated Oasis on the area.  I will see her back in 2 weeks.  Continue with KY dressing changes.

## 2020-01-18 ENCOUNTER — Ambulatory Visit (INDEPENDENT_AMBULATORY_CARE_PROVIDER_SITE_OTHER): Payer: Medicare Other | Admitting: Plastic Surgery

## 2020-01-18 ENCOUNTER — Other Ambulatory Visit: Payer: Self-pay

## 2020-01-18 ENCOUNTER — Encounter: Payer: Self-pay | Admitting: Plastic Surgery

## 2020-01-18 DIAGNOSIS — Z719 Counseling, unspecified: Secondary | ICD-10-CM

## 2020-01-18 NOTE — Progress Notes (Addendum)
   Subjective:    Patient ID: Erin Bruce, female    DOB: Jun 07, 1955, 64 y.o.   MRN: [id number]  The patient is a very sweet 64 year old female here with her twin sister.  She is here for follow-up on her abdominoplasty.  Overall she is doing much better.  The area is healing very nicely.  There is approximately 2 x 5 mm opening.  Nothing appears to be infected and there is no drainage noted today.     Review of Systems  Constitutional: Negative.   Eyes: Negative.   Respiratory: Negative.   Genitourinary: Negative.   Hematological: Negative.        Objective:   Physical Exam Vitals and nursing note reviewed.  Constitutional:      Appearance: Normal appearance.  HENT:     Head: Normocephalic and atraumatic.  Cardiovascular:     Rate and Rhythm: Normal rate.     Pulses: Normal pulses.  Pulmonary:     Effort: Pulmonary effort is normal.  Neurological:     General: No focal deficit present.     Mental Status: She is alert. Mental status is at baseline.  Psychiatric:        Mood and Affect: Mood normal.        Behavior: Behavior normal.         Assessment & Plan:     ICD-10-CM   1. Encounter for counseling  Z71.9     Donated ACell was applied.  Continue with the [place] dressing daily for 1 week and then Vaseline.

## 2020-02-01 ENCOUNTER — Ambulatory Visit: Payer: Medicare Other | Admitting: Plastic Surgery

## 2020-02-05 MED FILL — tiZANidine HCL 4 MG TABS: 4 | 20 days supply | Qty: 60 | Fill #0

## 2020-02-05 MED FILL — GABAPENTIN 300 MG CAPSULE: 300 | 30 days supply | Qty: 60 | Fill #4

## 2020-03-09 MED FILL — LEVOTHYROXINE 125 MCG TABLE: 125 | 90 days supply | Qty: 90 | Fill #1

## 2020-03-10 ENCOUNTER — Other Ambulatory Visit: Payer: Self-pay | Admitting: Unknown Physician Specialty

## 2020-03-10 MED FILL — GABAPENTIN 300 MG CAPSULE: 300 | 30 days supply | Qty: 60 | Fill #5

## 2020-03-12 ENCOUNTER — Other Ambulatory Visit: Payer: Self-pay | Admitting: Internal Medicine

## 2020-03-13 MED FILL — ALPRAZolam 0.25 MG TABS: 0.25 | 30 days supply | Qty: 90 | Fill #0

## 2020-04-07 ENCOUNTER — Encounter: Payer: Self-pay | Admitting: Plastic Surgery

## 2020-04-07 ENCOUNTER — Other Ambulatory Visit: Payer: Self-pay

## 2020-04-07 ENCOUNTER — Ambulatory Visit (INDEPENDENT_AMBULATORY_CARE_PROVIDER_SITE_OTHER): Payer: Medicare Other | Admitting: Plastic Surgery

## 2020-04-07 ENCOUNTER — Other Ambulatory Visit: Payer: Self-pay | Admitting: Surgical

## 2020-04-07 VITALS — BP 116/67 | HR 74 | Temp 97.3°F

## 2020-04-07 DIAGNOSIS — Z719 Counseling, unspecified: Secondary | ICD-10-CM

## 2020-04-07 MED ORDER — KETOROLAC TROMETHAMINE 10 MG PO TABS: 10.0000 mg | ORAL_TABLET | Freq: Three times a day (TID) | ORAL | 0 refills | Status: AC | PRN

## 2020-04-07 MED ORDER — TRAMADOL HCL 50 MG PO TABS: 50.0000 mg | ORAL_TABLET | Freq: Three times a day (TID) | ORAL | 0 refills | Status: DC | PRN

## 2020-04-07 NOTE — Progress Notes (Signed)
Procedure Note  Preoperative Dx: fat necrosis of abdomen  Postoperative Dx: Same  Procedure: excision of fat necrosis of abdomen 4 cm  Anesthesia: Lidocaine 1% with 1:100,000 epinepherine  Description of Procedure: Risks and complications were explained to the patient.  Consent was confirmed and the patient understands the risks and benefits.  The potential complications and alternatives were explained and the patient consents.  The patient expressed understanding the option of not having the procedure and the risks of a scar.  Time out was called and all information was confirmed to be correct.    The area was prepped and drapped.  Lidocaine 1% with epinepherine was injected in the subcutaneous area.   I waited several minutes for the local to take effect.  A 15 blade was then used to incise the skin at the previous scar site.  The 15 blade and tissue scissors were used to excise the fat necrosis.  Once I felt soft tissue hemostasis was achieved with pressure.  The deep layers were closed with 3-0 Monocryl followed by 4-0 Monocryl for subcuticular running closure.  Dermabond was applied.  A dressing was applied.  The patient was given instructions on how to care for the area and a follow up appointment.  Audrinna tolerated the procedure well and there were no complications.

## 2020-04-11 ENCOUNTER — Ambulatory Visit: Payer: Medicare Other | Admitting: Plastic Surgery

## 2020-04-22 ENCOUNTER — Ambulatory Visit (INDEPENDENT_AMBULATORY_CARE_PROVIDER_SITE_OTHER): Payer: Medicare Other | Admitting: Plastic Surgery

## 2020-04-22 ENCOUNTER — Other Ambulatory Visit: Payer: Self-pay

## 2020-04-22 ENCOUNTER — Encounter: Payer: Self-pay | Admitting: Plastic Surgery

## 2020-04-22 VITALS — BP 112/75 | HR 82 | Temp 97.8°F

## 2020-04-22 DIAGNOSIS — Z719 Counseling, unspecified: Secondary | ICD-10-CM

## 2020-04-22 NOTE — Progress Notes (Signed)
The patient is doing very well and seems to be healed.  We will remove the sutures today.  Plan for right side excision of fat necrosis in Feb.

## 2020-05-19 ENCOUNTER — Other Ambulatory Visit: Payer: Self-pay

## 2020-05-19 ENCOUNTER — Ambulatory Visit (INDEPENDENT_AMBULATORY_CARE_PROVIDER_SITE_OTHER): Payer: Medicare Other | Admitting: Plastic Surgery

## 2020-05-19 ENCOUNTER — Encounter: Payer: Self-pay | Admitting: Plastic Surgery

## 2020-05-19 VITALS — BP 114/67 | HR 68

## 2020-05-19 DIAGNOSIS — Z719 Counseling, unspecified: Secondary | ICD-10-CM | POA: Diagnosis not present

## 2020-05-19 NOTE — Progress Notes (Signed)
Procedure Note  Preoperative Dx: fat necrosis abdomen  Postoperative Dx: Same  Procedure: kenalog injection to abdomen   Description of Procedure: Risks and complications were explained to the patient.  Consent was confirmed and the patient understands the risks and benefits.  The potential complications and alternatives were explained and the patient consents.  The patient expressed understanding the option of not having the procedure and the risks of a scar.  Time out was called and all information was confirmed to be correct.    The area was prepped and drapped.  Lidocaine 1% with epinepherine 1 cc was mixed with kenalog 10 mg 2 cc.  The areas along the abdomen that were firm were injected.  A total of 2 cc of the mixture was used.  A dressing was applied.  The patient was given instructions on how to care for the area and a follow up appointment.  Erin Bruce tolerated the procedure well and there were no complications.

## 2020-06-25 ENCOUNTER — Other Ambulatory Visit: Payer: Self-pay | Admitting: Internal Medicine

## 2020-06-25 ENCOUNTER — Ambulatory Visit
Admission: RE | Admit: 2020-06-25 | Discharge: 2020-06-25 | Disposition: A | Discharge status: Home / Self Care | Payer: Medicare Other | Source: Ambulatory Visit | Attending: Internal Medicine | Admitting: Internal Medicine

## 2020-06-25 ENCOUNTER — Other Ambulatory Visit: Payer: Self-pay

## 2020-06-25 DIAGNOSIS — R1013 Epigastric pain: Secondary | ICD-10-CM

## 2020-06-25 MED ORDER — IOHEXOL 300 MG/ML  SOLN: 100.0000 mL | Freq: Once | INTRAMUSCULAR | Status: DC | PRN

## 2020-06-25 MED ORDER — IOHEXOL 300 MG/ML  SOLN
75.0000 mL | Freq: Once | INTRAMUSCULAR | Status: AC | PRN
  Administered 2020-06-25: 75 mL via INTRAVENOUS

## 2020-07-01 ENCOUNTER — Ambulatory Visit: Payer: Medicare Other | Admitting: Plastic Surgery

## 2020-07-01 ENCOUNTER — Other Ambulatory Visit (HOSPITAL_BASED_OUTPATIENT_CLINIC_OR_DEPARTMENT_OTHER): Payer: Self-pay

## 2020-07-02 ENCOUNTER — Ambulatory Visit: Payer: Medicare Other | Admitting: Surgical

## 2020-07-15 ENCOUNTER — Ambulatory Visit: Payer: Medicare Other | Admitting: Plastic Surgery

## 2020-07-25 NOTE — Addendum Note (Signed)
Encounter addended by: Novella Olive on: 07/25/2020 4:58 PM  Actions taken: Communication voided, Letter saved

## 2020-07-25 NOTE — Addendum Note (Signed)
Encounter addended by: Novella Olive on: 07/25/2020 4:56 PM  Actions taken: Letter saved

## 2020-08-11 ENCOUNTER — Other Ambulatory Visit: Payer: Self-pay | Admitting: Internal Medicine

## 2020-08-11 DIAGNOSIS — I10 Essential (primary) hypertension: Secondary | ICD-10-CM

## 2020-08-11 DIAGNOSIS — K7689 Other specified diseases of liver: Secondary | ICD-10-CM

## 2020-08-22 ENCOUNTER — Other Ambulatory Visit: Payer: Self-pay

## 2020-08-22 ENCOUNTER — Ambulatory Visit
Admission: RE | Admit: 2020-08-22 | Discharge: 2020-08-22 | Disposition: A | Discharge status: Home / Self Care | Payer: Medicare Other | Source: Ambulatory Visit | Attending: Internal Medicine | Admitting: Internal Medicine

## 2020-08-22 DIAGNOSIS — I10 Essential (primary) hypertension: Secondary | ICD-10-CM | POA: Diagnosis present

## 2020-08-22 DIAGNOSIS — K7689 Other specified diseases of liver: Secondary | ICD-10-CM | POA: Diagnosis not present

## 2020-08-22 MED ORDER — GADOBUTROL 1 MMOL/ML IV SOLN
6.0000 mL | Freq: Once | INTRAVENOUS | Status: AC | PRN
  Administered 2020-08-22: 6 mL via INTRAVENOUS

## 2020-08-26 ENCOUNTER — Other Ambulatory Visit: Payer: Self-pay

## 2020-08-26 ENCOUNTER — Encounter: Payer: Self-pay | Admitting: Plastic Surgery

## 2020-08-26 ENCOUNTER — Ambulatory Visit (INDEPENDENT_AMBULATORY_CARE_PROVIDER_SITE_OTHER): Payer: Medicare Other | Admitting: Plastic Surgery

## 2020-08-26 DIAGNOSIS — Z719 Counseling, unspecified: Secondary | ICD-10-CM

## 2020-08-26 NOTE — Progress Notes (Signed)
The patient is a 65 year old female here for evaluation of her abdominal scar.  The patient says she feels like it pulls and is very tender.  She has had several areas of fat necrosis excised in the past.  They seem to be doing very well.  She would like to have the central portion revised as well.  I think this is reasonable.  We did a Kenalog injection and she states it did not seem to help at all.  We will go ahead and get excision arranged for her in the office.

## 2020-08-27 NOTE — Telephone Encounter (Signed)
See msg

## 2020-09-19 ENCOUNTER — Encounter: Payer: Self-pay | Admitting: Plastic Surgery

## 2020-09-19 ENCOUNTER — Other Ambulatory Visit: Payer: Self-pay

## 2020-09-19 ENCOUNTER — Ambulatory Visit (INDEPENDENT_AMBULATORY_CARE_PROVIDER_SITE_OTHER): Payer: Medicare Other | Admitting: Plastic Surgery

## 2020-09-19 VITALS — BP 137/87 | HR 79

## 2020-09-19 DIAGNOSIS — Z719 Counseling, unspecified: Secondary | ICD-10-CM

## 2020-09-19 NOTE — Progress Notes (Signed)
Procedure Note  Preoperative Dx: Fat necrosis abdominal incision  Postoperative Dx: Same  Procedure: Excision of fat necrosis abdominal incision  Anesthesia: Lidocaine 1% with 1:100,000 epinepherine  Indication for Procedure: Fat necrosis  Description of Procedure: Risks and complications were explained to the patient.  Consent was confirmed and the patient understands the risks and benefits.  The potential complications and alternatives were explained and the patient consents.  The patient expressed understanding the option of not having the procedure and the risks of a scar.  Time out was called and all information was confirmed to be correct.    The area was prepped and drapped.  Lidocaine 1% with epinepherine was injected in the subcutaneous area.  After waiting several minutes for the local to take affect a #15 blade was used to excise a 2 mm thick scar by 4 cm long.  Using the 15 blade I was able to remove the fat necrosis and scar tissue that was tethering the medialmost portion of her abdominal incision to the fascia.  Hemostasis was achieved with pressure.  A 3-0 Monocryl was used to close the deep layers with simple interrupted stitches.  The skin edges were reapproximated with 4-0 Monocryl subcuticular running closure.  A dressing was applied.  The patient was given instructions on how to care for the area and a follow up appointment.  Aprel tolerated the procedure well and there were no complications.

## 2020-10-07 ENCOUNTER — Ambulatory Visit: Payer: Medicare Other | Admitting: Surgical

## 2020-10-10 ENCOUNTER — Ambulatory Visit: Payer: Medicare Other | Admitting: Surgical

## 2020-11-14 ENCOUNTER — Ambulatory Visit: Payer: Medicare Other | Admitting: Plastic Surgery

## 2021-06-29 ENCOUNTER — Other Ambulatory Visit: Payer: Self-pay | Admitting: Internal Medicine

## 2021-06-29 DIAGNOSIS — Z1231 Encounter for screening mammogram for malignant neoplasm of breast: Secondary | ICD-10-CM

## 2021-08-06 ENCOUNTER — Ambulatory Visit: Payer: Medicare Other

## 2021-08-10 ENCOUNTER — Ambulatory Visit
Admission: RE | Admit: 2021-08-10 | Discharge: 2021-08-10 | Disposition: A | Discharge status: Home / Self Care | Payer: Medicare Other | Source: Ambulatory Visit | Attending: Internal Medicine | Admitting: Internal Medicine

## 2021-08-10 DIAGNOSIS — Z1231 Encounter for screening mammogram for malignant neoplasm of breast: Secondary | ICD-10-CM

## 2022-05-27 ENCOUNTER — Emergency Department
Admission: EM | Admit: 2022-05-27 | Discharge: 2022-05-27 | Disposition: A | Discharge status: Home / Self Care | Payer: Medicare Other

## 2022-10-12 ENCOUNTER — Other Ambulatory Visit: Payer: Self-pay

## 2022-10-12 DIAGNOSIS — Z1231 Encounter for screening mammogram for malignant neoplasm of breast: Secondary | ICD-10-CM

## 2022-11-03 ENCOUNTER — Ambulatory Visit
Admission: RE | Admit: 2022-11-03 | Discharge: 2022-11-03 | Disposition: A | Discharge status: Home / Self Care | Payer: Medicare Other | Source: Ambulatory Visit | Attending: Internal Medicine | Admitting: Internal Medicine

## 2022-11-03 DIAGNOSIS — Z1231 Encounter for screening mammogram for malignant neoplasm of breast: Secondary | ICD-10-CM | POA: Diagnosis present

## 2023-11-16 ENCOUNTER — Other Ambulatory Visit: Payer: Self-pay | Admitting: Internal Medicine

## 2023-11-16 DIAGNOSIS — Z1231 Encounter for screening mammogram for malignant neoplasm of breast: Secondary | ICD-10-CM

## 2023-12-19 ENCOUNTER — Ambulatory Visit
Admission: RE | Admit: 2023-12-19 | Discharge: 2023-12-19 | Disposition: A | Discharge status: Home / Self Care | Source: Ambulatory Visit | Attending: Internal Medicine | Admitting: Internal Medicine

## 2023-12-19 DIAGNOSIS — Z1231 Encounter for screening mammogram for malignant neoplasm of breast: Secondary | ICD-10-CM | POA: Insufficient documentation
